# Patient Record
Sex: Female | Born: 1975 | Race: Black or African American | Hispanic: No | Marital: Single | Smoking: Never smoker
Health system: Southern US, Community
[De-identification: ages and names within clinical notes are randomized; demographics above are authoritative.]

## PROBLEM LIST (undated history)

## (undated) DIAGNOSIS — M169 Osteoarthritis of hip, unspecified: Secondary | ICD-10-CM

## (undated) DIAGNOSIS — R7303 Prediabetes: Secondary | ICD-10-CM

## (undated) DIAGNOSIS — M51369 Other intervertebral disc degeneration, lumbar region without mention of lumbar back pain or lower extremity pain: Secondary | ICD-10-CM

## (undated) DIAGNOSIS — M5136 Other intervertebral disc degeneration, lumbar region: Secondary | ICD-10-CM

## (undated) DIAGNOSIS — M797 Fibromyalgia: Secondary | ICD-10-CM

## (undated) DIAGNOSIS — I10 Essential (primary) hypertension: Secondary | ICD-10-CM

## (undated) HISTORY — PX: TONSILLECTOMY: SUR1361

## (undated) HISTORY — PX: TUBAL LIGATION: SHX77

## (undated) HISTORY — PX: ABDOMINAL HYSTERECTOMY: SHX81

---

## 2009-09-30 ENCOUNTER — Emergency Department (HOSPITAL_BASED_OUTPATIENT_CLINIC_OR_DEPARTMENT_OTHER): Admission: EM | Admit: 2009-09-30 | Discharge: 2009-10-01 | Payer: Self-pay | Admitting: Emergency Medicine

## 2009-10-01 ENCOUNTER — Ambulatory Visit: Payer: Self-pay | Admitting: Interventional Radiology

## 2010-09-28 ENCOUNTER — Emergency Department (HOSPITAL_BASED_OUTPATIENT_CLINIC_OR_DEPARTMENT_OTHER)
Admission: EM | Admit: 2010-09-28 | Discharge: 2010-09-28 | Payer: Self-pay | Source: Home / Self Care | Admitting: Emergency Medicine

## 2010-09-29 LAB — URINALYSIS, ROUTINE W REFLEX MICROSCOPIC
Bilirubin Urine: NEGATIVE
Hgb urine dipstick: NEGATIVE
Ketones, ur: NEGATIVE mg/dL
Nitrite: NEGATIVE
Protein, ur: NEGATIVE mg/dL
Specific Gravity, Urine: 1.027 (ref 1.005–1.030)
Urine Glucose, Fasting: NEGATIVE mg/dL
Urobilinogen, UA: 0.2 mg/dL (ref 0.0–1.0)
pH: 6 (ref 5.0–8.0)

## 2010-09-29 LAB — PREGNANCY, URINE: Preg Test, Ur: NEGATIVE

## 2010-09-29 LAB — WET PREP, GENITAL
Trich, Wet Prep: NONE SEEN
Yeast Wet Prep HPF POC: NONE SEEN

## 2010-10-04 LAB — GC/CHLAMYDIA PROBE AMP, GENITAL
Chlamydia, DNA Probe: NEGATIVE
GC Probe Amp, Genital: NEGATIVE

## 2010-11-29 LAB — DIFFERENTIAL
Basophils Absolute: 0.1 10*3/uL (ref 0.0–0.1)
Basophils Relative: 2 % — ABNORMAL HIGH (ref 0–1)
Eosinophils Absolute: 0 10*3/uL (ref 0.0–0.7)
Eosinophils Relative: 1 % (ref 0–5)
Lymphocytes Relative: 40 % (ref 12–46)
Lymphs Abs: 2.1 10*3/uL (ref 0.7–4.0)
Monocytes Absolute: 0.4 10*3/uL (ref 0.1–1.0)
Monocytes Relative: 7 % (ref 3–12)
Neutro Abs: 2.6 10*3/uL (ref 1.7–7.7)
Neutrophils Relative %: 50 % (ref 43–77)

## 2010-11-29 LAB — COMPREHENSIVE METABOLIC PANEL
ALT: 19 U/L (ref 0–35)
AST: 26 U/L (ref 0–37)
Albumin: 5.3 g/dL — ABNORMAL HIGH (ref 3.5–5.2)
Alkaline Phosphatase: 58 U/L (ref 39–117)
BUN: 16 mg/dL (ref 6–23)
CO2: 27 mEq/L (ref 19–32)
Calcium: 10 mg/dL (ref 8.4–10.5)
Chloride: 103 mEq/L (ref 96–112)
Creatinine, Ser: 0.9 mg/dL (ref 0.4–1.2)
GFR calc Af Amer: >60 mL/min (ref >60)
GFR calc non Af Amer: >60 mL/min (ref >60)
Glucose, Bld: 71 mg/dL (ref 70–99)
Potassium: 4.3 mEq/L (ref 3.5–5.1)
Sodium: 144 mEq/L (ref 135–145)
Total Bilirubin: 0.9 mg/dL (ref 0.3–1.2)
Total Protein: 9.2 g/dL — ABNORMAL HIGH (ref 6.0–8.3)

## 2010-11-29 LAB — URINALYSIS, ROUTINE W REFLEX MICROSCOPIC
Glucose, UA: NEGATIVE mg/dL
Hgb urine dipstick: NEGATIVE
Ketones, ur: 40 mg/dL — AB
Nitrite: NEGATIVE
Protein, ur: NEGATIVE mg/dL
Specific Gravity, Urine: 1.03 (ref 1.005–1.030)
Urobilinogen, UA: 0.2 mg/dL (ref 0.0–1.0)
pH: 6 (ref 5.0–8.0)

## 2010-11-29 LAB — CBC
HCT: 43.5 % (ref 36.0–46.0)
Hemoglobin: 14.4 g/dL (ref 12.0–15.0)
MCHC: 33.1 g/dL (ref 30.0–36.0)
MCV: 83.5 fL (ref 78.0–100.0)
Platelets: 268 10*3/uL (ref 150–400)
RBC: 5.2 MIL/uL — ABNORMAL HIGH (ref 3.87–5.11)
RDW: 12.3 % (ref 11.5–15.5)
WBC: 5.2 10*3/uL (ref 4.0–10.5)

## 2010-11-29 LAB — PREGNANCY, URINE: Preg Test, Ur: NEGATIVE

## 2010-11-29 LAB — LIPASE, BLOOD: Lipase: 202 U/L (ref 23–300)

## 2010-12-14 ENCOUNTER — Emergency Department (INDEPENDENT_AMBULATORY_CARE_PROVIDER_SITE_OTHER): Payer: Self-pay

## 2010-12-14 ENCOUNTER — Emergency Department (HOSPITAL_BASED_OUTPATIENT_CLINIC_OR_DEPARTMENT_OTHER)
Admission: EM | Admit: 2010-12-14 | Discharge: 2010-12-14 | Disposition: A | Discharge status: Home / Self Care | Payer: Self-pay | Attending: Emergency Medicine | Admitting: Emergency Medicine

## 2010-12-14 DIAGNOSIS — N898 Other specified noninflammatory disorders of vagina: Secondary | ICD-10-CM | POA: Insufficient documentation

## 2010-12-14 DIAGNOSIS — R1031 Right lower quadrant pain: Secondary | ICD-10-CM

## 2010-12-14 DIAGNOSIS — N8000 Endometriosis of the uterus, unspecified: Secondary | ICD-10-CM | POA: Insufficient documentation

## 2010-12-14 DIAGNOSIS — I10 Essential (primary) hypertension: Secondary | ICD-10-CM | POA: Insufficient documentation

## 2010-12-14 DIAGNOSIS — R109 Unspecified abdominal pain: Secondary | ICD-10-CM | POA: Insufficient documentation

## 2010-12-14 LAB — WET PREP, GENITAL
Clue Cells Wet Prep HPF POC: NONE SEEN
Trich, Wet Prep: NONE SEEN
Yeast Wet Prep HPF POC: NONE SEEN

## 2010-12-14 LAB — URINALYSIS, ROUTINE W REFLEX MICROSCOPIC
Bilirubin Urine: NEGATIVE
Glucose, UA: NEGATIVE mg/dL
Hgb urine dipstick: NEGATIVE
Ketones, ur: NEGATIVE mg/dL
Nitrite: NEGATIVE
Protein, ur: NEGATIVE mg/dL
Specific Gravity, Urine: 1.023 (ref 1.005–1.030)
Urobilinogen, UA: 0.2 mg/dL (ref 0.0–1.0)
pH: 6 (ref 5.0–8.0)

## 2010-12-14 LAB — PREGNANCY, URINE: Preg Test, Ur: NEGATIVE

## 2010-12-15 LAB — GC/CHLAMYDIA PROBE AMP, GENITAL
Chlamydia, DNA Probe: NEGATIVE
GC Probe Amp, Genital: NEGATIVE

## 2011-08-22 ENCOUNTER — Emergency Department (HOSPITAL_BASED_OUTPATIENT_CLINIC_OR_DEPARTMENT_OTHER)
Admission: EM | Admit: 2011-08-22 | Discharge: 2011-08-22 | Disposition: A | Discharge status: Home / Self Care | Payer: Self-pay | Attending: Emergency Medicine | Admitting: Emergency Medicine

## 2011-08-22 ENCOUNTER — Encounter: Payer: Self-pay | Admitting: *Deleted

## 2011-08-22 DIAGNOSIS — J069 Acute upper respiratory infection, unspecified: Secondary | ICD-10-CM | POA: Insufficient documentation

## 2011-08-22 DIAGNOSIS — I10 Essential (primary) hypertension: Secondary | ICD-10-CM | POA: Insufficient documentation

## 2011-08-22 DIAGNOSIS — J111 Influenza due to unidentified influenza virus with other respiratory manifestations: Secondary | ICD-10-CM

## 2011-08-22 DIAGNOSIS — R059 Cough, unspecified: Secondary | ICD-10-CM | POA: Insufficient documentation

## 2011-08-22 DIAGNOSIS — R05 Cough: Secondary | ICD-10-CM | POA: Insufficient documentation

## 2011-08-22 HISTORY — DX: Essential (primary) hypertension: I10

## 2011-08-22 NOTE — ED Provider Notes (Signed)
History     CSN: [id number] Arrival date & time: 08/22/2011  3:56 PM   First MD Initiated Contact with Patient 08/22/11 1539      Chief Complaint  Patient presents with  . URI    (Consider location/radiation/quality/duration/timing/severity/associated sxs/prior treatment) HPI Patient with uri symptoms, sore throat, fever, cough, rhinorrhea, muscle aches, nausea, and vomiting beginning on Saturday.  Patient without fever now for 36 hours.  No flu shot.  Last vomited this a.m.  Taking po with nausea but no vomiting now.  No diarrhea.   Exposure in home to children with similar symptoms.  Patient taking otc meds.   Past Medical History  Diagnosis Date  . Hypertension     Past Surgical History  Procedure Date  . Tubal ligation   . Tonsillectomy     No family history on file.  History  Substance Use Topics  . Smoking status: Never Smoker   . Smokeless tobacco: Not on file  . Alcohol Use: No    OB History    Grav Para Term Preterm Abortions TAB SAB Ect Mult Living                  Review of Systems  All other systems reviewed and are negative.    Allergies  Review of patient's allergies indicates no known allergies.  Home Medications  No current outpatient prescriptions on file.  BP 150/90  Pulse 91  Temp(Src) 99.4 F (37.4 C) (Oral)  Resp 22  SpO2 100%  Physical Exam  Constitutional: She is oriented to person, place, and time. She appears well-developed and well-nourished.  HENT:  Head: Normocephalic and atraumatic.  Right Ear: External ear normal.  Left Ear: External ear normal.  Nose: Nose normal.  Mouth/Throat: Oropharynx is clear and moist.  Eyes: Conjunctivae and EOM are normal. Pupils are equal, round, and reactive to light.  Neck: Normal range of motion. Neck supple.  Cardiovascular: Normal rate and regular rhythm.   Pulmonary/Chest: Effort normal and breath sounds normal.  Abdominal: Bowel sounds are normal.  Musculoskeletal: Normal range  of motion.  Neurological: She is alert and oriented to person, place, and time. She has normal reflexes.  Skin: Skin is warm and dry.  Psychiatric: She has a normal mood and affect. Her behavior is normal. Judgment and thought content normal.    ED Course  Procedures (including critical care time)  Labs Reviewed - No data to display No results found.   No diagnosis found.    MDM          Hilario Quarry, MD 08/22/11 [phone]

## 2011-08-22 NOTE — ED Notes (Signed)
Headache sore throat ear pain aching all over and fever x 2 days.

## 2011-08-22 NOTE — ED Notes (Signed)
No answer in waiting room 

## 2014-12-18 DIAGNOSIS — G43009 Migraine without aura, not intractable, without status migrainosus: Secondary | ICD-10-CM | POA: Insufficient documentation

## 2014-12-18 DIAGNOSIS — I10 Essential (primary) hypertension: Secondary | ICD-10-CM | POA: Insufficient documentation

## 2015-05-29 DIAGNOSIS — K219 Gastro-esophageal reflux disease without esophagitis: Secondary | ICD-10-CM | POA: Insufficient documentation

## 2015-08-21 DIAGNOSIS — G43009 Migraine without aura, not intractable, without status migrainosus: Secondary | ICD-10-CM | POA: Insufficient documentation

## 2016-02-22 DIAGNOSIS — M7918 Myalgia, other site: Secondary | ICD-10-CM | POA: Insufficient documentation

## 2016-02-22 DIAGNOSIS — M47816 Spondylosis without myelopathy or radiculopathy, lumbar region: Secondary | ICD-10-CM | POA: Insufficient documentation

## 2016-03-10 ENCOUNTER — Encounter (HOSPITAL_BASED_OUTPATIENT_CLINIC_OR_DEPARTMENT_OTHER): Payer: Self-pay | Admitting: *Deleted

## 2016-03-10 ENCOUNTER — Emergency Department (HOSPITAL_BASED_OUTPATIENT_CLINIC_OR_DEPARTMENT_OTHER)
Admission: EM | Admit: 2016-03-10 | Discharge: 2016-03-10 | Disposition: A | Discharge status: Home / Self Care | Payer: PRIVATE HEALTH INSURANCE | Attending: Emergency Medicine | Admitting: Emergency Medicine

## 2016-03-10 DIAGNOSIS — M545 Low back pain: Secondary | ICD-10-CM

## 2016-03-10 DIAGNOSIS — I10 Essential (primary) hypertension: Secondary | ICD-10-CM | POA: Insufficient documentation

## 2016-03-10 LAB — PREGNANCY, URINE: Preg Test, Ur: NEGATIVE

## 2016-03-10 MED ORDER — HYDROCODONE-ACETAMINOPHEN 5-325 MG PO TABS: 2.0000 | ORAL_TABLET | ORAL | Status: DC | PRN

## 2016-03-10 MED ORDER — DIAZEPAM 5 MG/ML IJ SOLN
5.0000 mg | Freq: Once | INTRAMUSCULAR | Status: AC
  Administered 2016-03-10: 5 mg via INTRAVENOUS
  Filled 2016-03-10: qty 2

## 2016-03-10 MED ORDER — CYCLOBENZAPRINE HCL 10 MG PO TABS: 10.0000 mg | ORAL_TABLET | Freq: Two times a day (BID) | ORAL | Status: DC | PRN

## 2016-03-10 MED ORDER — NAPROXEN 500 MG PO TABS: 500.0000 mg | ORAL_TABLET | Freq: Two times a day (BID) | ORAL | Status: DC

## 2016-03-10 MED ORDER — METHYLPREDNISOLONE 4 MG PO TBPK: ORAL_TABLET | ORAL | Status: DC

## 2016-03-10 MED ORDER — MORPHINE SULFATE (PF) 4 MG/ML IV SOLN
4.0000 mg | Freq: Once | INTRAVENOUS | Status: AC
  Administered 2016-03-10: 4 mg via INTRAVENOUS
  Filled 2016-03-10: qty 1

## 2016-03-10 MED ORDER — ONDANSETRON HCL 4 MG/2ML IJ SOLN
4.0000 mg | Freq: Once | INTRAMUSCULAR | Status: AC
  Administered 2016-03-10: 4 mg via INTRAVENOUS
  Filled 2016-03-10: qty 2

## 2016-03-10 MED ORDER — KETOROLAC TROMETHAMINE 30 MG/ML IJ SOLN
30.0000 mg | Freq: Once | INTRAMUSCULAR | Status: AC
  Administered 2016-03-10: 30 mg via INTRAVENOUS
  Filled 2016-03-10: qty 1

## 2016-03-10 MED ORDER — DEXAMETHASONE SODIUM PHOSPHATE 10 MG/ML IJ SOLN
10.0000 mg | Freq: Once | INTRAMUSCULAR | Status: AC
  Administered 2016-03-10: 10 mg via INTRAVENOUS
  Filled 2016-03-10: qty 1

## 2016-03-10 NOTE — ED Notes (Signed)
Lower back pain x 2 months. States she was going to a chiropractor that was not helping her pain.

## 2016-03-10 NOTE — ED Notes (Signed)
Pt verbalizes understanding of d/c instructions and denies any further needs at this time. 

## 2016-03-10 NOTE — Discharge Instructions (Signed)
I have scheduled an outpatient MRI for further evaluation of your back pain.  Follow up with Dr. Pearletha ForgeHudnall in 1 week.  Take the Medrol dosepak, Naprosyn, Flexeril, and Norco as needed for pain control.  Back Pain, Adult Back pain is very common in adults.The cause of back pain is rarely dangerous and the pain often gets better over time.The cause of your back pain may not be known. Some common causes of back pain include:  Strain of the muscles or ligaments supporting the spine.  Wear and tear (degeneration) of the spinal disks.  Arthritis.  Direct injury to the back. For many people, back pain may return. Since back pain is rarely dangerous, most people can learn to manage this condition on their own. HOME CARE INSTRUCTIONS Watch your back pain for any changes. The following actions may help to lessen any discomfort you are feeling:  Remain active. It is stressful on your back to sit or stand in one place for long periods of time. Do not sit, drive, or stand in one place for more than 30 minutes at a time. Take short walks on even surfaces as soon as you are able.Try to increase the length of time you walk each day.  Exercise regularly as directed by your health care provider. Exercise helps your back heal faster. It also helps avoid future injury by keeping your muscles strong and flexible.  Do not stay in bed.Resting more than 1-2 days can delay your recovery.  Pay attention to your body when you bend and lift. The most comfortable positions are those that put less stress on your recovering back. Always use proper lifting techniques, including:  Bending your knees.  Keeping the load close to your body.  Avoiding twisting.  Find a comfortable position to sleep. Use a firm mattress and lie on your side with your knees slightly bent. If you lie on your back, put a pillow under your knees.  Avoid feeling anxious or stressed.Stress increases muscle tension and can worsen back  pain.It is important to recognize when you are anxious or stressed and learn ways to manage it, such as with exercise.  Take medicines only as directed by your health care provider. Over-the-counter medicines to reduce pain and inflammation are often the most helpful.Your health care provider may prescribe muscle relaxant drugs.These medicines help dull your pain so you can more quickly return to your normal activities and healthy exercise.  Apply ice to the injured area:  Put ice in a plastic bag.  Place a towel between your skin and the bag.  Leave the ice on for 20 minutes, 2-3 times a day for the first 2-3 days. After that, ice and heat may be alternated to reduce pain and spasms.  Maintain a healthy weight. Excess weight puts extra stress on your back and makes it difficult to maintain good posture. SEEK MEDICAL CARE IF:  You have pain that is not relieved with rest or medicine.  You have increasing pain going down into the legs or buttocks.  You have pain that does not improve in one week.  You have night pain.  You lose weight.  You have a fever or chills. SEEK IMMEDIATE MEDICAL CARE IF:   You develop new bowel or bladder control problems.  You have unusual weakness or numbness in your arms or legs.  You develop nausea or vomiting.  You develop abdominal pain.  You feel faint.   This information is not intended to replace advice given  to you by your health care provider. Make sure you discuss any questions you have with your health care provider.   Document Released: 08/29/2005 Document Revised: 09/19/2014 Document Reviewed: 12/31/2013 Elsevier Interactive Patient Education [employer].

## 2016-03-10 NOTE — ED Provider Notes (Signed)
CSN: [id number]     Arrival date & time 03/10/16  1720 History   First MD Initiated Contact with Patient 03/10/16 1729     Chief Complaint  Patient presents with  . Back Pain     (Consider location/radiation/quality/duration/timing/severity/associated sxs/prior Treatment) Patient is a 40 y.o. female presenting with back pain. The history is provided by the patient.  Back Pain Location:  Lumbar spine Quality:  Stabbing and shooting Radiates to:  L posterior upper leg and R posterior upper leg Pain severity:  Moderate Pain is:  Same all the time Onset quality:  Gradual Duration:  2 months Timing:  Constant Progression:  Unchanged Relieved by:  NSAIDs and narcotics Worsened by:  Standing Ineffective treatments:  None tried Associated symptoms: leg pain   Associated symptoms: no abdominal pain, no bladder incontinence, no bowel incontinence, no dysuria, no fever, no numbness, no paresthesias, no pelvic pain, no perianal numbness, no tingling, no weakness and no weight loss   Risk factors: not obese, not pregnant, no recent surgery and no steroid use    Patient presents for evaluation of constant, sharp, stabbing low back pain that radiates to bilateral thighs. No injury or trauma. She has tried hydrocodone, mobilc and going today chiropractor without relief. She has an appointment scheduled with a spine surgeon in August. No fever, chills, abdominal pain, urinary symptoms, pelvic pain, saddle anesthesia, bowel or bladder incontinence.  No IVDU.  She states she has had xrays done which were normal.   Past Medical History  Diagnosis Date  . Hypertension    Past Surgical History  Procedure Laterality Date  . Tubal ligation    . Tonsillectomy     No family history on file. Social History  Substance Use Topics  . Smoking status: Never Smoker   . Smokeless tobacco: None  . Alcohol Use: No   OB History    No data available     Review of Systems  Constitutional: Negative for  fever and weight loss.  Gastrointestinal: Negative for abdominal pain and bowel incontinence.  Genitourinary: Negative for bladder incontinence, dysuria and pelvic pain.  Musculoskeletal: Positive for back pain.  Neurological: Negative for tingling, weakness, numbness and paresthesias.  All other systems reviewed and are negative.     Allergies  Review of patient's allergies indicates no known allergies.  Home Medications   Prior to Admission medications   Not on File   BP 158/104 mmHg  Pulse 72  Temp(Src) 98.2 F (36.8 C) (Oral)  Resp 20  Ht 5\' 2"  (1.575 m)  Wt 72.576 kg  BMI 29.26 kg/m2  SpO2 100%  LMP 02/08/2016 Physical Exam  Constitutional: She is oriented to person, place, and time. She appears well-developed and well-nourished.  HENT:  Head: Atraumatic.  Eyes: Conjunctivae are normal.  Cardiovascular: Normal rate, regular rhythm, normal heart sounds and intact distal pulses.   Pulses:      Dorsalis pedis pulses are 2+ on the right side, and 2+ on the left side.  Pulmonary/Chest: Effort normal and breath sounds normal.  Abdominal: Soft. Bowel sounds are normal. She exhibits no distension. There is no tenderness.  Musculoskeletal: She exhibits tenderness.  No spinous process tenderness.  No step offs. No crepitus.  Diffuse lumbar and sciatic notch tenderness.   Neurological: She is alert and oriented to person, place, and time.  No saddle anesthesia. Strength and sensation intact bilaterally throughout lower extremities.  Normal gait.   Skin: Skin is warm and dry.  Psychiatric: She has  a normal mood and affect. Her behavior is normal.    ED Course  Procedures (including critical care time) Labs Review Labs Reviewed  PREGNANCY, URINE    Imaging Review No results found. I have personally reviewed and evaluated these images and lab results as part of my medical decision-making.   EKG Interpretation None      MDM   Final diagnoses:  Bilateral low back  pain, with sciatica presence unspecified   Patient presents with back pain.  VSS, NAD.  No injury/trauma.  Normal neurological exam.  Doubt epidural abscess, cauda equina, AAA.  She is not pregnant. Patient received Toradal, Decadron, Valium, and Morphine in ED with improvement.  [facility]  Plan to discharge home with Methylprednisolone, Naproxen, and Norco. Outpatient MRI scheduled.  Follow up PCP.  Discussed return precautions.  Patient agrees and acknowledges the above plan for discharge.      Cheri FowlerKayla Celise Bazar, PA-C 03/10/16 1952  Jacalyn LefevreJulie Haviland, MD 03/10/16 2238

## 2016-03-11 ENCOUNTER — Other Ambulatory Visit (HOSPITAL_BASED_OUTPATIENT_CLINIC_OR_DEPARTMENT_OTHER): Payer: Self-pay | Admitting: *Deleted

## 2016-03-11 DIAGNOSIS — Z0189 Encounter for other specified special examinations: Secondary | ICD-10-CM

## 2016-03-11 DIAGNOSIS — Z139 Encounter for screening, unspecified: Secondary | ICD-10-CM

## 2016-03-11 DIAGNOSIS — Z1389 Encounter for screening for other disorder: Secondary | ICD-10-CM

## 2016-03-12 ENCOUNTER — Ambulatory Visit (HOSPITAL_BASED_OUTPATIENT_CLINIC_OR_DEPARTMENT_OTHER)
Admission: RE | Admit: 2016-03-12 | Discharge: 2016-03-12 | Disposition: A | Discharge status: Home / Self Care | Payer: PRIVATE HEALTH INSURANCE | Source: Ambulatory Visit | Attending: *Deleted | Admitting: *Deleted

## 2016-03-12 ENCOUNTER — Ambulatory Visit (HOSPITAL_BASED_OUTPATIENT_CLINIC_OR_DEPARTMENT_OTHER)
Admission: RE | Admit: 2016-03-12 | Discharge: 2016-03-12 | Disposition: A | Discharge status: Home / Self Care | Payer: PRIVATE HEALTH INSURANCE | Source: Ambulatory Visit | Attending: Emergency Medicine | Admitting: Emergency Medicine

## 2016-03-12 DIAGNOSIS — Z1389 Encounter for screening for other disorder: Secondary | ICD-10-CM | POA: Insufficient documentation

## 2016-03-12 DIAGNOSIS — N949 Unspecified condition associated with female genital organs and menstrual cycle: Secondary | ICD-10-CM | POA: Insufficient documentation

## 2016-03-12 DIAGNOSIS — M545 Low back pain: Secondary | ICD-10-CM | POA: Diagnosis not present

## 2016-03-21 ENCOUNTER — Ambulatory Visit (INDEPENDENT_AMBULATORY_CARE_PROVIDER_SITE_OTHER): Payer: PRIVATE HEALTH INSURANCE | Admitting: Family Medicine

## 2016-03-21 VITALS — BP 141/100 | HR 80 | Ht 62.0 in | Wt 160.0 lb

## 2016-03-21 DIAGNOSIS — M549 Dorsalgia, unspecified: Secondary | ICD-10-CM

## 2016-03-21 NOTE — Patient Instructions (Signed)
Your exam is reassuring. You have already tried steroids, anti-inflammatories, muscle relaxants, and pain medication, chiropractic care. Start physical therapy and do home exercises/stretches on days you don't go to therapy. Follow up with me 4-6 weeks after starting this. As mentioned follow up with your doctor about the 7cm cyst though this is unrelated to your back pain. If not improving I would recommend going ahead with imaging of your cervical spine.

## 2016-03-22 ENCOUNTER — Encounter: Payer: Self-pay | Admitting: Family Medicine

## 2016-03-23 DIAGNOSIS — M549 Dorsalgia, unspecified: Secondary | ICD-10-CM | POA: Insufficient documentation

## 2016-03-23 NOTE — Progress Notes (Signed)
PCP: No PCP Per Patient  Subjective:   HPI: Patient is a 40 y.o. female here for neck, back pain.  Patient reports for at least a couple months she has had pain on left side of body. Was afraid she was having a stroke initially. Pain currently gets posterior left neck, upper back, low back. Improved with rest, worse with motion. Pain up to 10/10 and sharp. Now taking naproxen, flexeril with norco as needed. Not much improvement with prednisone dose pack also. Tried chiropractic care. MRI of lumbar spine normal except for a 7cm cyst - recommended f/u with PCP. No bowel/bladder dysfunction. Can radiates down legs and arms at times. No skin changes, current numbness.  Past Medical History  Diagnosis Date  . Hypertension     Current Outpatient Prescriptions on File Prior to Visit  Medication Sig Dispense Refill  . cyclobenzaprine (FLEXERIL) 10 MG tablet Take 1 tablet (10 mg total) by mouth 2 (two) times daily as needed for muscle spasms. 10 tablet 0  . HYDROcodone-acetaminophen (NORCO/VICODIN) 5-325 MG tablet Take 2 tablets by mouth every 4 (four) hours as needed. 12 tablet 0  . methylPREDNISolone (MEDROL DOSEPAK) 4 MG TBPK tablet Follow instructions on package. 21 tablet 0  . naproxen (NAPROSYN) 500 MG tablet Take 1 tablet (500 mg total) by mouth 2 (two) times daily. 30 tablet 0   No current facility-administered medications on file prior to visit.    Past Surgical History  Procedure Laterality Date  . Tubal ligation    . Tonsillectomy      Allergies  Allergen Reactions  . Lisinopril Shortness Of Breath    Social History   Social History  . Marital Status: Single    Spouse Name: N/A  . Number of Children: N/A  . Years of Education: N/A   Occupational History  . Not on file.   Social History Main Topics  . Smoking status: Never Smoker   . Smokeless tobacco: Not on file  . Alcohol Use: No  . Drug Use: No  . Sexual Activity: Not on file   Other Topics Concern   . Not on file   Social History Narrative    No family history on file.  BP 141/100 mmHg  Pulse 80  Ht 5\' 2"  (1.575 m)  Wt 160 lb (72.576 kg)  BMI 29.26 kg/m2  LMP 02/08/2016  Review of Systems: See HPI above.    Objective:  Physical Exam:  Gen: NAD, comfortable in exam room  Neck: No gross deformity, swelling, bruising. TTP left > right paraspinal cervical regions.  No midline/bony TTP. FROM neck - pain on flexion, extension. BUE strength 5/5.   Sensation intact to light touch.   2+ equal reflexes in triceps, biceps, brachioradialis tendons. Negative spurlings. NV intact distal BUEs.  Back: No gross deformity, scoliosis. TTP left > right paraspinal lumbar regions.  No midline or bony TTP. FROM. Strength LEs 5/5 all muscle groups.   2+ MSRs in patellar and achilles tendons, equal bilaterally. Negative SLRs. Sensation intact to light touch bilaterally. Negative logroll bilateral hips Negative fabers and piriformis stretches.    Assessment & Plan:  1. Neck, back pain - exam is reassuring.  Consistent with muscle strain.  Start physical therapy, home exercise program.  Has already tried steroid dose pack, nsaids, muscle relaxants, pain medication, chiropractic care.  Had lumbar spine MRI which was reassuring also.  F/u in 4-6 weeks.  Consider imaging of cervical spine if not improving.

## 2016-03-23 NOTE — Assessment & Plan Note (Signed)
exam is reassuring.  Consistent with muscle strain.  Start physical therapy, home exercise program.  Has already tried steroid dose pack, nsaids, muscle relaxants, pain medication, chiropractic care.  Had lumbar spine MRI which was reassuring also.  F/u in 4-6 weeks.  Consider imaging of cervical spine if not improving.

## 2016-04-18 ENCOUNTER — Encounter: Payer: Self-pay | Admitting: Family Medicine

## 2016-04-18 ENCOUNTER — Ambulatory Visit (INDEPENDENT_AMBULATORY_CARE_PROVIDER_SITE_OTHER): Payer: PRIVATE HEALTH INSURANCE | Admitting: Family Medicine

## 2016-04-18 VITALS — BP 170/120 | HR 81 | Ht 62.0 in | Wt 152.0 lb

## 2016-04-18 DIAGNOSIS — M542 Cervicalgia: Secondary | ICD-10-CM | POA: Diagnosis not present

## 2016-04-18 DIAGNOSIS — M549 Dorsalgia, unspecified: Secondary | ICD-10-CM

## 2016-04-18 MED ORDER — NORTRIPTYLINE HCL 25 MG PO CAPS: 25.0000 mg | ORAL_CAPSULE | Freq: Every day | ORAL | 2 refills | Status: AC

## 2016-04-18 NOTE — Patient Instructions (Signed)
Next step is to go ahead with a cervical spine MRI. If this is normal I think you should see a rheumatologist for evaluation, rule out autoimmune causes of pain, weakness. I would encourage you to be as active as possible, stretching, light cardio (cycling, walking, anything in the water), very light weightlifting. Your pain generally is less the stronger these muscles are (less likely to spasm).

## 2016-04-20 ENCOUNTER — Encounter (HOSPITAL_BASED_OUTPATIENT_CLINIC_OR_DEPARTMENT_OTHER): Payer: Self-pay | Admitting: Emergency Medicine

## 2016-04-20 ENCOUNTER — Emergency Department (HOSPITAL_BASED_OUTPATIENT_CLINIC_OR_DEPARTMENT_OTHER): Payer: PRIVATE HEALTH INSURANCE

## 2016-04-20 ENCOUNTER — Emergency Department (HOSPITAL_BASED_OUTPATIENT_CLINIC_OR_DEPARTMENT_OTHER)
Admission: EM | Admit: 2016-04-20 | Discharge: 2016-04-20 | Disposition: A | Discharge status: Home / Self Care | Payer: PRIVATE HEALTH INSURANCE | Attending: Emergency Medicine | Admitting: Emergency Medicine

## 2016-04-20 ENCOUNTER — Ambulatory Visit: Payer: Medicaid Other | Admitting: Family Medicine

## 2016-04-20 DIAGNOSIS — R197 Diarrhea, unspecified: Secondary | ICD-10-CM | POA: Insufficient documentation

## 2016-04-20 DIAGNOSIS — Z79899 Other long term (current) drug therapy: Secondary | ICD-10-CM | POA: Insufficient documentation

## 2016-04-20 DIAGNOSIS — R072 Precordial pain: Secondary | ICD-10-CM | POA: Diagnosis present

## 2016-04-20 DIAGNOSIS — I10 Essential (primary) hypertension: Secondary | ICD-10-CM | POA: Diagnosis not present

## 2016-04-20 DIAGNOSIS — R1013 Epigastric pain: Secondary | ICD-10-CM | POA: Insufficient documentation

## 2016-04-20 LAB — CBC WITH DIFFERENTIAL/PLATELET
Band Neutrophils: 3 %
Basophils Absolute: 0 K/uL (ref 0.0–0.1)
Basophils Relative: 0 %
Eosinophils Absolute: 0.3 K/uL (ref 0.0–0.7)
Eosinophils Relative: 5 %
HCT: 41.3 % (ref 36.0–46.0)
Hemoglobin: 13.6 g/dL (ref 12.0–15.0)
Lymphocytes Relative: 42 %
Lymphs Abs: 2.4 K/uL (ref 0.7–4.0)
MCH: 27.5 pg (ref 26.0–34.0)
MCHC: 32.9 g/dL (ref 30.0–36.0)
MCV: 83.6 fL (ref 78.0–100.0)
Monocytes Absolute: 0.3 K/uL (ref 0.1–1.0)
Monocytes Relative: 6 %
Neutro Abs: 2.7 K/uL (ref 1.7–7.7)
Neutrophils Relative %: 44 %
Platelets: 234 K/uL (ref 150–400)
RBC: 4.94 MIL/uL (ref 3.87–5.11)
RDW: 13.3 % (ref 11.5–15.5)
WBC: 5.7 K/uL (ref 4.0–10.5)

## 2016-04-20 LAB — URINALYSIS, ROUTINE W REFLEX MICROSCOPIC
Bilirubin Urine: NEGATIVE
Glucose, UA: NEGATIVE mg/dL
Hgb urine dipstick: NEGATIVE
Ketones, ur: NEGATIVE mg/dL
Nitrite: NEGATIVE
Protein, ur: NEGATIVE mg/dL
Specific Gravity, Urine: 1.014 (ref 1.005–1.030)
pH: 6.5 (ref 5.0–8.0)

## 2016-04-20 LAB — COMPREHENSIVE METABOLIC PANEL WITH GFR
ALT: 15 U/L (ref 14–54)
AST: 24 U/L (ref 15–41)
Albumin: 4.5 g/dL (ref 3.5–5.0)
Alkaline Phosphatase: 38 U/L (ref 38–126)
Anion gap: 8 (ref 5–15)
BUN: 16 mg/dL (ref 6–20)
CO2: 29 mmol/L (ref 22–32)
Calcium: 9.7 mg/dL (ref 8.9–10.3)
Chloride: 102 mmol/L (ref 101–111)
Creatinine, Ser: 1.08 mg/dL — ABNORMAL HIGH (ref 0.44–1.00)
GFR calc Af Amer: >60 mL/min
GFR calc non Af Amer: >60 mL/min
Glucose, Bld: 86 mg/dL (ref 65–99)
Potassium: 4.2 mmol/L (ref 3.5–5.1)
Sodium: 139 mmol/L (ref 135–145)
Total Bilirubin: 0.8 mg/dL (ref 0.3–1.2)
Total Protein: 7.7 g/dL (ref 6.5–8.1)

## 2016-04-20 LAB — URINE MICROSCOPIC-ADD ON: RBC / HPF: NONE SEEN RBC/hpf (ref 0–5)

## 2016-04-20 LAB — PREGNANCY, URINE: Preg Test, Ur: NEGATIVE

## 2016-04-20 LAB — LIPASE, BLOOD: Lipase: 40 U/L (ref 11–51)

## 2016-04-20 MED ORDER — ONDANSETRON HCL 4 MG/2ML IJ SOLN
4.0000 mg | Freq: Once | INTRAMUSCULAR | Status: DC
  Filled 2016-04-20: qty 2

## 2016-04-20 MED ORDER — ONDANSETRON 8 MG PO TBDP
8.0000 mg | ORAL_TABLET | Freq: Once | ORAL | Status: AC
  Administered 2016-04-20: 8 mg via ORAL
  Filled 2016-04-20: qty 1

## 2016-04-20 MED ORDER — GI COCKTAIL ~~LOC~~
30.0000 mL | Freq: Once | ORAL | Status: AC
  Administered 2016-04-20: 30 mL via ORAL
  Filled 2016-04-20: qty 30

## 2016-04-20 MED ORDER — MORPHINE SULFATE (PF) 4 MG/ML IV SOLN
4.0000 mg | Freq: Once | INTRAVENOUS | Status: AC
  Administered 2016-04-20: 4 mg via INTRAMUSCULAR

## 2016-04-20 MED ORDER — MORPHINE SULFATE (PF) 4 MG/ML IV SOLN
4.0000 mg | Freq: Once | INTRAVENOUS | Status: DC
  Filled 2016-04-20: qty 1

## 2016-04-20 MED ORDER — ACETAMINOPHEN 500 MG PO TABS
1000.0000 mg | ORAL_TABLET | Freq: Once | ORAL | Status: AC
  Administered 2016-04-20: 1000 mg via ORAL
  Filled 2016-04-20: qty 2

## 2016-04-20 NOTE — ED Provider Notes (Signed)
[facility] Provider Note   CSN: [id number] Arrival date & time: 04/20/16  1643  First Provider Contact:   First MD Initiated Contact with Patient 04/20/16 1653     By signing my name below, I, Emmanuella Mensah, attest that this documentation has been prepared under the direction and in the presence of Roxy Horseman, PA-C. Electronically Signed: Angelene Giovanni, ED Scribe. 04/20/16. 5:02 PM.    History   Chief Complaint Chief Complaint  Patient presents with  . Abdominal Pain  . Chest Pain    HPI Comments: Melissa Clay is a 40 y.o. female with a hx of hypertension and GERD who presents to the Emergency Department complaining of ongoing moderate substernal chest pain that radiates to her back onset yesterday. She reports associated nausea, multiple episodes of non-bloody diarrhea, upper abdominal pain, and generalized body aches. She states that eating makes the pain worse so she has not been able to eat appropriately. No alleviating factors noted. Pt has not tried any medications PTA. She denies any fever, chills, vomiting, shortness of breath, cough, vaginal bleeding, or vaginal discharge.    The history is provided by the patient. No language interpreter was used.    Past Medical History:  Diagnosis Date  . Hypertension     Patient Active Problem List   Diagnosis Date Noted  . Back pain 03/23/2016  . Facet syndrome, lumbar 02/22/2016  . Myofascial pain 02/22/2016  . Migraine without aura and responsive to treatment 08/21/2015  . Gastro-esophageal reflux disease without esophagitis 05/29/2015  . Atypical migraine 12/18/2014  . Benign essential HTN 12/18/2014    Past Surgical History:  Procedure Laterality Date  . TONSILLECTOMY    . TUBAL LIGATION      OB History    No data available       Home Medications    Prior to Admission medications   Medication Sig Start Date End Date Taking? Authorizing Provider  nortriptyline (PAMELOR) 25 MG  capsule Take 1 capsule (25 mg total) by mouth at bedtime. 04/18/16  Yes Lenda Kelp, MD  propranolol (INDERAL) 40 MG tablet Take 40 mg by mouth. 01/28/16  Yes Historical Provider, MD  butalbital-acetaminophen-caffeine (FIORICET WITH CODEINE) 50-325-40-30 MG capsule Take by mouth.    Historical Provider, MD  hydrALAZINE (APRESOLINE) 25 MG tablet Take 25 mg by mouth. 01/28/16   Historical Provider, MD  HYDROcodone-acetaminophen (NORCO/VICODIN) 5-325 MG tablet Take 2 tablets by mouth every 4 (four) hours as needed. 03/10/16   Cheri Fowler, PA-C  methylPREDNISolone (MEDROL DOSEPAK) 4 MG TBPK tablet Follow instructions on package. 03/10/16   Cheri Fowler, PA-C  naproxen (NAPROSYN) 500 MG tablet Take 1 tablet (500 mg total) by mouth 2 (two) times daily. 03/10/16   Cheri Fowler, PA-C  omeprazole (PRILOSEC) 20 MG capsule Take 20 mg by mouth. 01/14/16 01/13/17  Historical Provider, MD    Family History No family history on file.  Social History Social History  Substance Use Topics  . Smoking status: Never Smoker  . Smokeless tobacco: Never Used  . Alcohol use No     Allergies   Lisinopril   Review of Systems Review of Systems  Constitutional: Negative for chills and fever.  Respiratory: Negative for cough and shortness of breath.   Cardiovascular: Positive for chest pain.  Gastrointestinal: Positive for abdominal pain, diarrhea and nausea. Negative for vomiting.  Genitourinary: Negative for vaginal bleeding and vaginal discharge.     Physical Exam Updated Vital Signs BP (!) 162/117   Pulse  72   Temp 97.7 F (36.5 C)   LMP 04/05/2016   SpO2 100%   Physical Exam  Constitutional: She is oriented to person, place, and time. She appears well-developed and well-nourished.  HENT:  Head: Normocephalic and atraumatic.  Eyes: Conjunctivae and EOM are normal. Pupils are equal, round, and reactive to light.  Neck: Normal range of motion. Neck supple.  Cardiovascular: Normal rate and regular rhythm.   Exam reveals no gallop and no friction rub.   No murmur heard. Pulmonary/Chest: Effort normal and breath sounds normal. No respiratory distress. She has no wheezes. She has no rales. She exhibits no tenderness.  CTAB  Abdominal: Soft. Bowel sounds are normal. She exhibits no distension and no mass. There is tenderness. There is no rebound and no guarding.  RUQ moderately tender to palpation with some epigastric abdominal tenderness, no other focal abdominal tenderness  Musculoskeletal: Normal range of motion. She exhibits no edema or tenderness.  Neurological: She is alert and oriented to person, place, and time.  Skin: Skin is warm and dry.  Psychiatric: She has a normal mood and affect. Her behavior is normal. Judgment and thought content normal.  Nursing note and vitals reviewed.    ED Treatments / Results  DIAGNOSTIC STUDIES: Oxygen Saturation is 100% on RA, normal by my interpretation.    COORDINATION OF CARE: 4:59 PM- Pt advised of plan for treatment and pt agrees. Pt will receive US of gall bladder for further evaluation.    Labs (all labs ordered are listed, but only abnormal results are displayed) Labs Reviewed - No data to display  EKG  EKG Interpretation None      ED ECG REPORT  I personally interpreted this EKG   Date: 04/20/2016   Rate: 71  Rhythm: normal sinus rhythm  QRS Axis: normal  Intervals: normal  ST/T Wave abnormalities: normal  Conduction Disutrbances:none  Narrative Interpretation:   Old EKG Reviewed: none available   Radiology No results found.  Procedures Procedures (including critical care time)  Medications Ordered in ED Medications - No data to display   Initial Impression / Assessment and Plan / ED Course  Roxy Horsemanobert Tyaire Odem, PA-C has reviewed the triage vital signs and the nursing notes.  Pertinent labs & imaging results that were available during my care of the patient were reviewed by me and considered in my medical decision making  (see chart for details).  Clinical Course    Patient with vague epigastric/upper abdomen/inferior chest pain.  Symptoms worsen with eating and radiate to back.  Will check LFTs and RUQ US.  No ischemic findings on EKG.  Symptoms do not sound cardiac in nature.  She doesn't have a cough or a fever.  Lung sounds are clear.    Right upper quadrant ultrasound is negative, LFTs not elevated, lipase normal, no leukocytosis, blood counts and electrolytes are all normal.  I discussed the patient with Dr. Donnald GarrePfeiffer, who has reviewed the labs and investigative the chart. Patient has close follow-up. She has been seen multiple times for pain complaints. At this time I do not feel that additional emergent workup is indicated. She has no numbness or weakness on exam.  Dr. Donnald GarrePfeiffer agrees with the plan.    6:48 PM Patient states that she now has a headache.  Will give tylenol prior to discharge.   Final Clinical Impressions(s) / ED Diagnoses   Final diagnoses:  Epigastric pain   I personally performed the services described in this documentation, which was scribed  in my presence. The recorded information has been reviewed and is accurate.     New Prescriptions New Prescriptions   No medications on file     Roxy Horseman, PA-C 04/20/16 1849    Arby Barrette, MD 04/20/16 2325

## 2016-04-20 NOTE — ED Triage Notes (Signed)
Pt having chest/abdominal pain radiating to her back for two days.  Some Nausea and diarrhea.  Eating makes it worse.  Pt denies travel.  Pt states she has some sob with it but no respiratory distress noted.

## 2016-04-20 NOTE — ED Notes (Signed)
PA at bedside.

## 2016-04-20 NOTE — ED Notes (Signed)
Pt given d/c instructions as per chart. Verbalizes understanding. No questions. 

## 2016-04-20 NOTE — ED Notes (Signed)
PA notified of pts continued 10/10 pain.  Pt sts she now also has a migraine and wants med for her migraine and nausea.  Zofran did not help either.  Pt reporting pain in her chest, upper and lower abdomen bilaterally, and in her legs. Room darkened for pts comfort. Pt is resting quietly with a rag over her eyes.

## 2016-04-20 NOTE — ED Notes (Signed)
Patient transported to Ultrasound 

## 2016-04-20 NOTE — ED Notes (Signed)
2 unsuccessful IV attempts.  1 using ultrasound.  Enough blood collected to fill pediatric tubes.  Will take to lab.  Will get another staff to look for IV site.

## 2016-04-21 NOTE — Progress Notes (Addendum)
PCP: No PCP Per Patient  Subjective:   HPI: Patient is a 40 y.o. female here for neck, back pain.  7/10: Patient reports for at least a couple months she has had pain on left side of body. Was afraid she was having a stroke initially. Pain currently gets posterior left neck, upper back, low back. Improved with rest, worse with motion. Pain up to 10/10 and sharp. Now taking naproxen, flexeril with norco as needed. Not much improvement with prednisone dose pack also. Tried chiropractic care. MRI of lumbar spine normal except for a 7cm cyst - recommended f/u with PCP. No bowel/bladder dysfunction. Can radiates down legs and arms at times. No skin changes, current numbness.  8/8: Patient returns reporting continued 10/10 sharp pain especially in low back radiating down legs to her feet. Reports some swelling into left thigh. Difficulty standing or sitting for prolonged periods - worsens pain. No bowel/bladder dysfunction.  Past Medical History:  Diagnosis Date  . Hypertension     Current Outpatient Prescriptions on File Prior to Visit  Medication Sig Dispense Refill  . butalbital-acetaminophen-caffeine (FIORICET WITH CODEINE) 50-325-40-30 MG capsule Take by mouth.    . hydrALAZINE (APRESOLINE) 25 MG tablet Take 25 mg by mouth.    Marland Kitchen HYDROcodone-acetaminophen (NORCO/VICODIN) 5-325 MG tablet Take 2 tablets by mouth every 4 (four) hours as needed. 12 tablet 0  . methylPREDNISolone (MEDROL DOSEPAK) 4 MG TBPK tablet Follow instructions on package. 21 tablet 0  . naproxen (NAPROSYN) 500 MG tablet Take 1 tablet (500 mg total) by mouth 2 (two) times daily. 30 tablet 0  . omeprazole (PRILOSEC) 20 MG capsule Take 20 mg by mouth.    . propranolol (INDERAL) 40 MG tablet Take 40 mg by mouth.     No current facility-administered medications on file prior to visit.     Past Surgical History:  Procedure Laterality Date  . TONSILLECTOMY    . TUBAL LIGATION      Allergies  Allergen  Reactions  . Lisinopril Shortness Of Breath    Social History   Social History  . Marital status: Single    Spouse name: N/A  . Number of children: N/A  . Years of education: N/A   Occupational History  . Not on file.   Social History Main Topics  . Smoking status: Never Smoker  . Smokeless tobacco: Never Used  . Alcohol use No  . Drug use: No  . Sexual activity: Not on file   Other Topics Concern  . Not on file   Social History Narrative  . No narrative on file    No family history on file.  BP (!) 170/120   Pulse 81   Ht  (1.575 m)   Wt 152 lb (68.9 kg)   BMI 27.80 kg/m   Review of Systems: See HPI above.    Objective:  Physical Exam:  Gen: NAD, comfortable in exam room  Neck: No gross deformity, swelling, bruising. TTP left > right paraspinal cervical regions.  No midline/bony TTP. FROM neck - pain on flexion, extension. BUE strength 5/5.   Sensation intact to light touch.   NV intact distal BUEs.  Back: No gross deformity, scoliosis. TTP left > right paraspinal lumbar regions.  No midline or bony TTP. FROM. Strength LEs 5/5 all muscle groups.   2+ MSRs in patellar and achilles tendons, equal bilaterally. Negative SLRs. Sensation intact to light touch bilaterally. Negative logroll bilateral hips Negative fabers and piriformis stretches.    Assessment &  Plan:  1. Neck, back pain - exam is reassuring.  Severity is 10/10.  Some description is non anatomic (radiation into both legs) or not present - noted no swelling in left thigh at least currently.  MRI of lumbar spine is normal and reassuring.  S/p steroid dose pack, nsaids, muscle relaxants, pain medication, chiropractic care.  From an orthopedic standpoint we had discussed cervical spine MRI which would complete our evaluation - will go ahead with this.  I would put consideration into a possible rheumatologic cause, myofascial pain, or somatization disorder if this is normal.    Addendum:   MRI reviewed of cervical spine and discussed with patient - nothing noted here to account for her pain, symptoms.  She noted her PCP has discussed evaluation for fibromyalgia - I encouraged her to go through with this, request referral from them.  This completes the orthopedic workup of her symptoms.  Also advised her to take FMLA paperwork to them - no orthopedic diagnosis to warrant out of work time, missed days.

## 2016-04-21 NOTE — Assessment & Plan Note (Signed)
exam is reassuring.  Severity is 10/10.  Some description is non anatomic (radiation into both legs) or not present - noted no swelling in left thigh at least currently.  MRI of lumbar spine is normal and reassuring.  S/p steroid dose pack, nsaids, muscle relaxants, pain medication, chiropractic care.  From an orthopedic standpoint we had discussed cervical spine MRI which would complete our evaluation - will go ahead with this.  I would put consideration into a possible rheumatologic cause, myofascial pain, or somatization disorder if this is normal.

## 2016-04-26 ENCOUNTER — Ambulatory Visit: Payer: Medicaid Other | Admitting: Family Medicine

## 2016-04-30 ENCOUNTER — Ambulatory Visit (HOSPITAL_BASED_OUTPATIENT_CLINIC_OR_DEPARTMENT_OTHER)
Admission: RE | Admit: 2016-04-30 | Discharge: 2016-04-30 | Disposition: A | Discharge status: Home / Self Care | Payer: PRIVATE HEALTH INSURANCE | Source: Ambulatory Visit | Attending: Family Medicine | Admitting: Family Medicine

## 2016-04-30 DIAGNOSIS — M542 Cervicalgia: Secondary | ICD-10-CM | POA: Diagnosis present

## 2016-04-30 DIAGNOSIS — M50222 Other cervical disc displacement at C5-C6 level: Secondary | ICD-10-CM | POA: Insufficient documentation

## 2016-05-06 ENCOUNTER — Telehealth: Payer: Self-pay | Admitting: Family Medicine

## 2016-05-06 NOTE — Telephone Encounter (Signed)
Called patient 8/21 afternoon to provide MRI results and to ask about FMLA paperwork she dropped off without instructions (she was only written out of work for a couple days, typically paperwork not needed for this short of a period).  Left her a voicemail to call us back.

## 2016-05-13 NOTE — Telephone Encounter (Signed)
Had not heard back - called her and left another voicemail.

## 2016-05-17 ENCOUNTER — Telehealth: Payer: Self-pay | Admitting: Family Medicine

## 2016-05-20 NOTE — Telephone Encounter (Signed)
Called patient yesterday and left voicemail.

## 2016-05-23 NOTE — Telephone Encounter (Signed)
Spoke with patient on 9/8 - will put an addendum into her chart.

## 2018-06-09 IMAGING — DX DG ORBITS FOR FOREIGN BODY
2 series · 2 of 2 positions shown · non-contrast
Comparison: None.

CLINICAL DATA: Metal working/exposure; clearance prior to MRI

EXAM:
ORBITS FOR FOREIGN BODY - 2 VIEW

[orbits waters (1 of 2)]
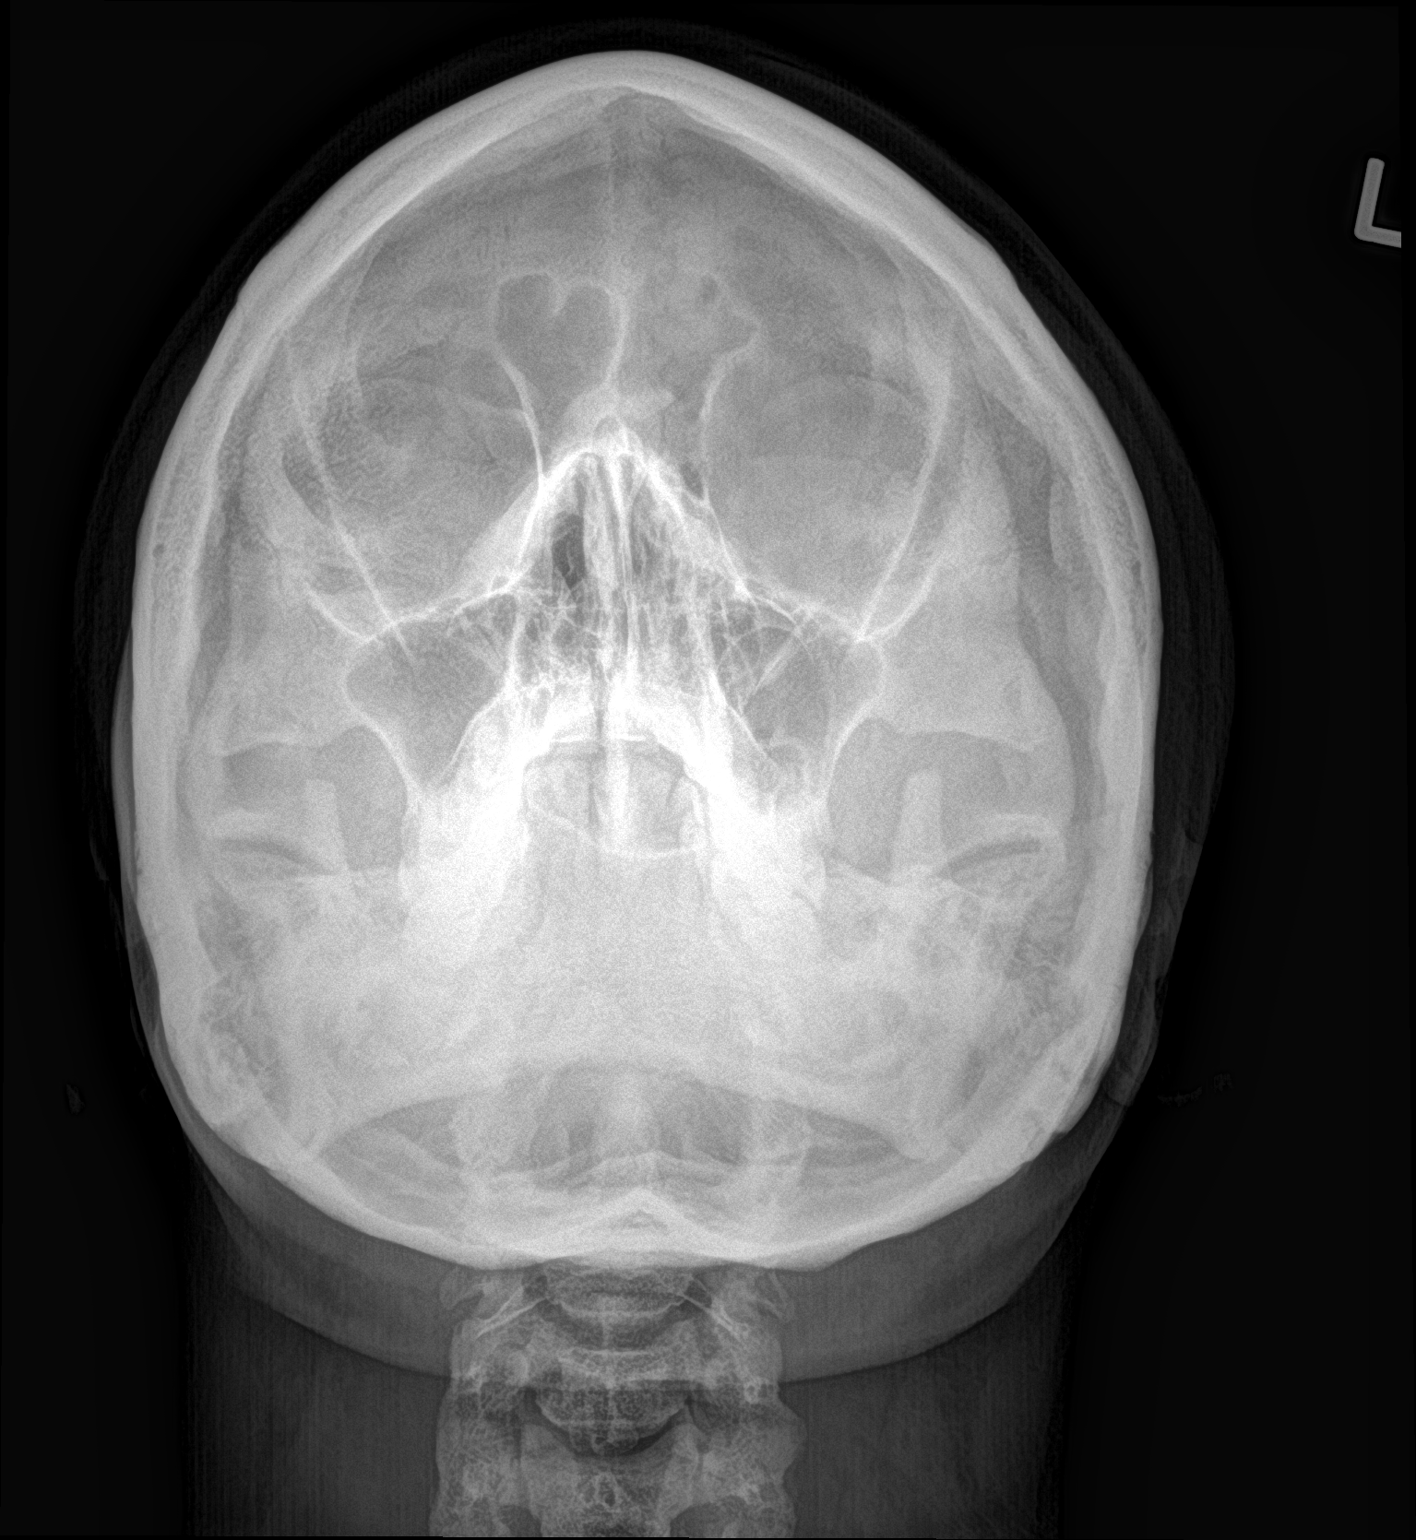

[orbits waters (2 of 2)]
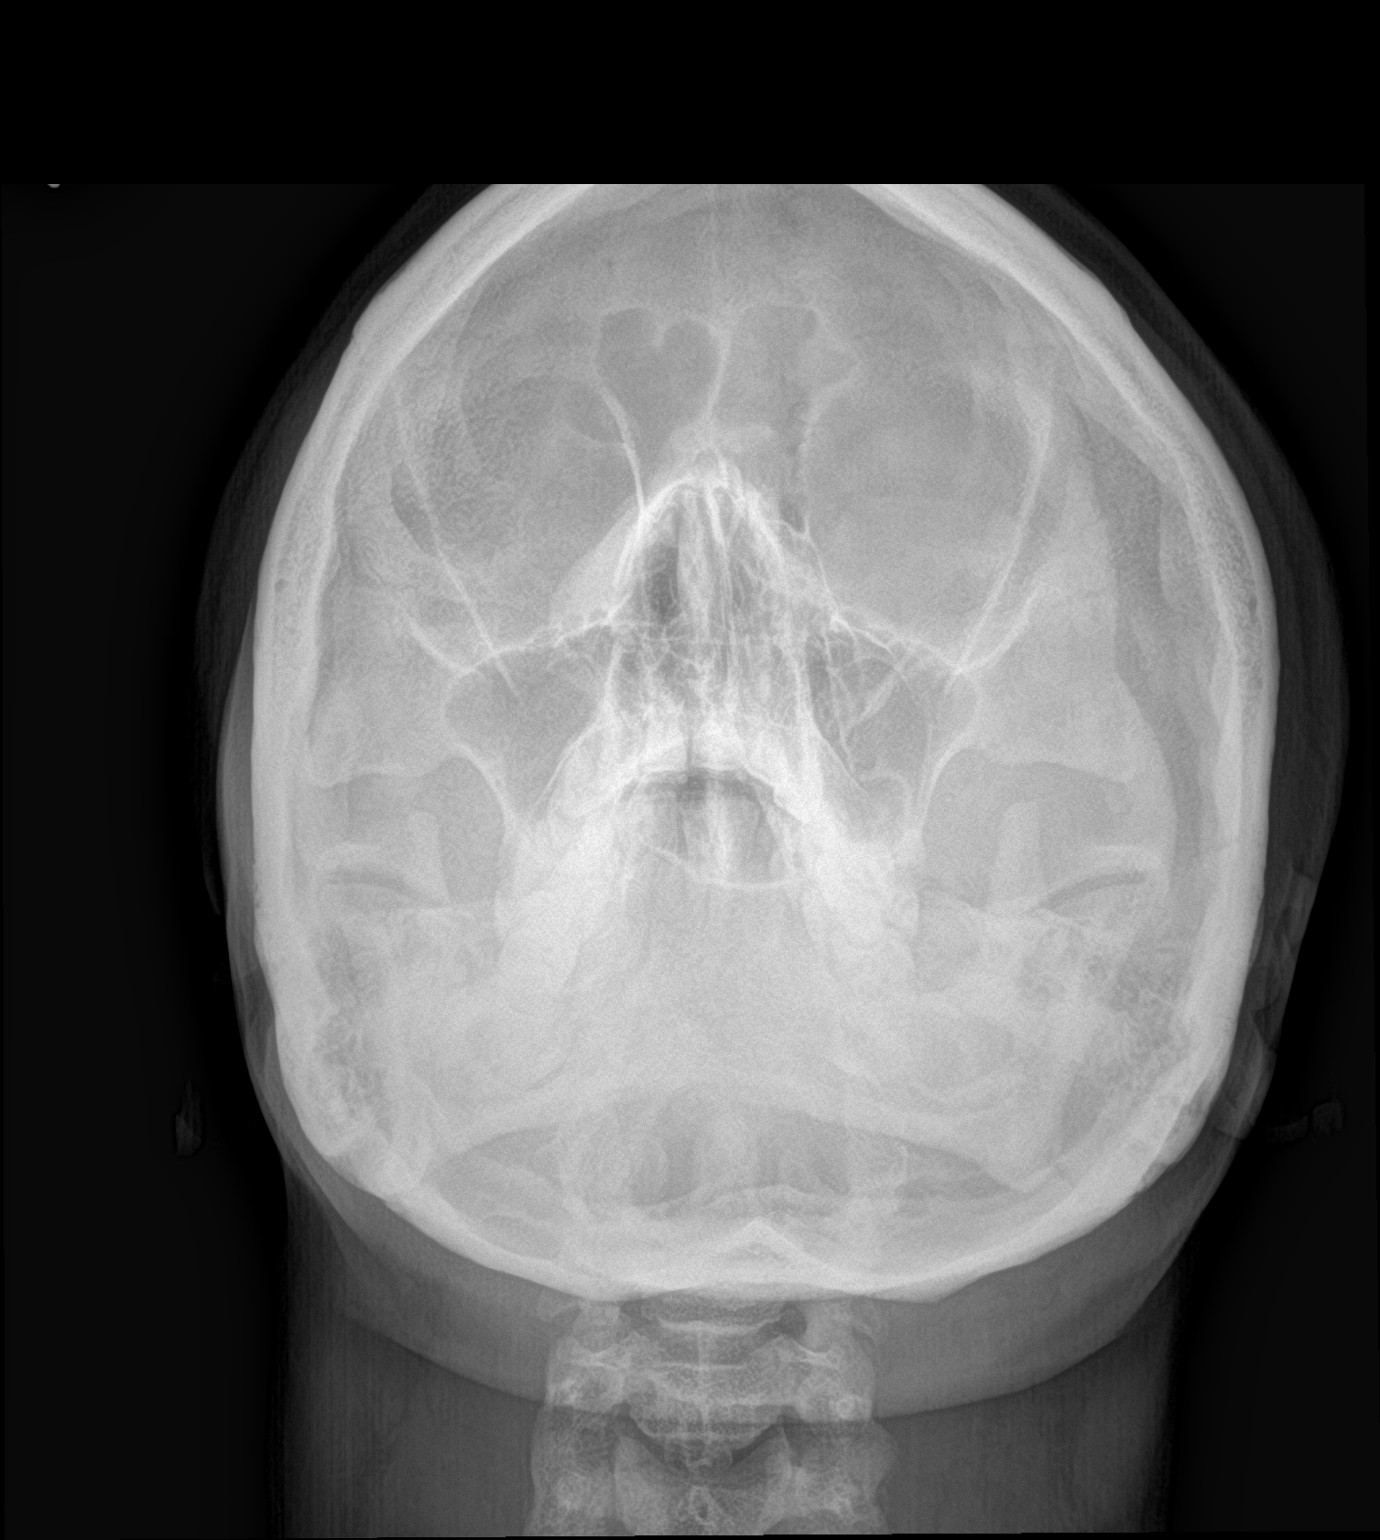

[2 of 2 positions shown; findings below may reference images not displayed]

FINDINGS: There is no evidence of metallic foreign body within the orbits. No
significant bone abnormality identified.
IMPRESSION: No evidence of metallic foreign body within the orbits.

## 2019-09-15 ENCOUNTER — Other Ambulatory Visit: Payer: Self-pay

## 2019-09-15 ENCOUNTER — Encounter (HOSPITAL_BASED_OUTPATIENT_CLINIC_OR_DEPARTMENT_OTHER): Payer: Self-pay | Admitting: Emergency Medicine

## 2019-09-15 ENCOUNTER — Emergency Department (HOSPITAL_BASED_OUTPATIENT_CLINIC_OR_DEPARTMENT_OTHER)
Admission: EM | Admit: 2019-09-15 | Discharge: 2019-09-15 | Disposition: A | Discharge status: Home / Self Care | Payer: PRIVATE HEALTH INSURANCE | Attending: Emergency Medicine | Admitting: Emergency Medicine

## 2019-09-15 DIAGNOSIS — Z79899 Other long term (current) drug therapy: Secondary | ICD-10-CM | POA: Insufficient documentation

## 2019-09-15 DIAGNOSIS — M79601 Pain in right arm: Secondary | ICD-10-CM

## 2019-09-15 DIAGNOSIS — Z888 Allergy status to other drugs, medicaments and biological substances status: Secondary | ICD-10-CM | POA: Diagnosis not present

## 2019-09-15 DIAGNOSIS — M79602 Pain in left arm: Secondary | ICD-10-CM | POA: Insufficient documentation

## 2019-09-15 DIAGNOSIS — I10 Essential (primary) hypertension: Secondary | ICD-10-CM | POA: Diagnosis not present

## 2019-09-15 DIAGNOSIS — G8929 Other chronic pain: Secondary | ICD-10-CM | POA: Diagnosis not present

## 2019-09-15 MED ORDER — CELECOXIB 200 MG PO CAPS: 200.0000 mg | ORAL_CAPSULE | Freq: Two times a day (BID) | ORAL | 0 refills | Status: AC

## 2019-09-15 MED ORDER — METHYLPREDNISOLONE 4 MG PO TBPK: ORAL_TABLET | ORAL | 0 refills | Status: AC

## 2019-09-15 NOTE — ED Notes (Signed)
Pt states she feels tingling and pain down into left hand in addition to arm and neck pain

## 2019-09-15 NOTE — Discharge Instructions (Addendum)
Contact a health care provider if: °Your pain and other symptoms get worse. °Your pain medicine is not helping. °Your pain has not improved after a few weeks of home care. °You have a fever. °Get help right away if: °You have severe pain, weakness, or numbness. °You have difficulty with bladder or bowel control. °

## 2019-09-15 NOTE — ED Provider Notes (Signed)
[facility] EMERGENCY DEPARTMENT Provider Note   CSN: [id number] Arrival date & time: 09/15/19  1341     History Chief Complaint  Patient presents with  . Arm Pain    Melissa Clay is a 44 y.o. female with a pmh of chronic L arm pain.  She complains of pain throughout her entire left arm, left shoulder blade.  She has numbness and tingling in her fingers.  She feels like her hand is always very cold.  She has seen a hand specialist and is scheduled for an MRI.  She has been taking without significant relief and states that her pain is worse which brought her in.  She feels like there is some swelling in the left thenar eminence.  She has pain with movement of her thumb which appears new.  She denies any new weakness in the arm.  HPI     Past Medical History:  Diagnosis Date  . Hypertension     Patient Active Problem List   Diagnosis Date Noted  . Back pain 03/23/2016  . Facet syndrome, lumbar 02/22/2016  . Myofascial pain 02/22/2016  . Migraine without aura and responsive to treatment 08/21/2015  . Gastro-esophageal reflux disease without esophagitis 05/29/2015  . Atypical migraine 12/18/2014  . Benign essential HTN 12/18/2014    Past Surgical History:  Procedure Laterality Date  . ABDOMINAL HYSTERECTOMY    . TONSILLECTOMY    . TUBAL LIGATION       OB History   No obstetric history on file.     No family history on file.  Social History   Tobacco Use  . Smoking status: Never Smoker  . Smokeless tobacco: Never Used  Substance Use Topics  . Alcohol use: No    Alcohol/week: 0.0 standard drinks  . Drug use: No    Home Medications Prior to Admission medications   Medication Sig Start Date End Date Taking? Authorizing Provider  chlorthalidone (HYGROTON) 25 MG tablet TAKE ONE-HALF TABLET BY MOUTH EVERY DAY 06/13/19  Yes [provider]  butalbital-acetaminophen-caffeine (FIORICET WITH CODEINE) 50-325-40-30 MG capsule Take by mouth.     [provider]  celecoxib (CELEBREX) 200 MG capsule Take 1 capsule (200 mg total) by mouth 2 (two) times daily. 09/15/19   Arthor Captain, PA-C  hydrALAZINE (APRESOLINE) 25 MG tablet Take 25 mg by mouth. 01/28/16   [provider]  methylPREDNISolone (MEDROL DOSEPAK) 4 MG TBPK tablet Use as directed 09/15/19   Arthor Captain, PA-C  nortriptyline (PAMELOR) 25 MG capsule Take 1 capsule (25 mg total) by mouth at bedtime. 04/18/16   Hudnall, Azucena Fallen, MD  omeprazole (PRILOSEC) 20 MG capsule Take 20 mg by mouth. 01/14/16 01/13/17  [provider]  propranolol (INDERAL) 40 MG tablet Take 40 mg by mouth. 01/28/16   [provider]    Allergies    Lisinopril  Review of Systems   Review of Systems Ten systems reviewed and are negative for acute change, except as noted in the HPI.   Physical Exam Updated Vital Signs BP (!) 137/95 (BP Location: Right Arm)   Pulse 89   Temp 99.3 F (37.4 C) (Oral)   Resp 14   Ht 5\' 2"  (1.575 m)   Wt 74.4 kg   LMP 04/05/2016   SpO2 97%   BMI 30.00 kg/m   Physical Exam Vitals and nursing note reviewed.  Constitutional:      General: She is not in acute distress.    Appearance: She is well-developed.  She is not diaphoretic.  HENT:     Head: Normocephalic and atraumatic.  Eyes:     General: No scleral icterus.    Conjunctiva/sclera: Conjunctivae normal.  Cardiovascular:     Rate and Rhythm: Normal rate and regular rhythm.     Heart sounds: Normal heart sounds. No murmur. No friction rub. No gallop.   Pulmonary:     Effort: Pulmonary effort is normal. No respiratory distress.     Breath sounds: Normal breath sounds.  Abdominal:     General: Bowel sounds are normal. There is no distension.     Palpations: Abdomen is soft. There is no mass.     Tenderness: There is no abdominal tenderness. There is no guarding.  Musculoskeletal:     Cervical back: Normal range of motion.     Comments: Left arm tender to palpation.  No  obvious swelling, normal radial pulse.  Minimal swelling of the thenar eminence on the left with tenderness along the flexor tendon of the left thumb.  No catching with movement of the thumb.  Decreased range of motion due to pain.  Skin:    General: Skin is warm and dry.  Neurological:     Mental Status: She is alert and oriented to person, place, and time.  Psychiatric:        Behavior: Behavior normal.     ED Results / Procedures / Treatments   Labs (all labs ordered are listed, but only abnormal results are displayed) Labs Reviewed - No data to display  EKG None  Radiology No results found.  Procedures Procedures (including critical care time)  Medications Ordered in ED Medications - No data to display  ED Course  I have reviewed the triage vital signs and the nursing notes.  Pertinent labs & imaging results that were available during my care of the patient were reviewed by me and considered in my medical decision making (see chart for details).    MDM Rules/Calculators/A&P                      Patient with chronic left arm pain.  Differential includes radiculopathy, complex regional pain syndrome, she may also have a new tenosynovitis.  Patient has a thumb spica splint which she wears.  Will discharge patient with Celebrex and Medrol Dosepak.  She is advised to continue with her current pain plan follow closely with her specialist.  Discussed return precautions. Final Clinical Impression(s) / ED Diagnoses Final diagnoses:  Right arm pain    Rx / DC Orders ED Discharge Orders         Ordered    methylPREDNISolone (MEDROL DOSEPAK) 4 MG TBPK tablet     09/15/19 1755    celecoxib (CELEBREX) 200 MG capsule  2 times daily     09/15/19 1755           Margarita Mail, PA-C 09/15/19 1822    Wyvonnia Dusky, MD 09/16/19 1247

## 2019-09-15 NOTE — ED Triage Notes (Signed)
L arm pain radiating into the neck for months. Is out of her meloxicam. Has an MRI pending.

## 2021-04-20 ENCOUNTER — Other Ambulatory Visit: Payer: Self-pay

## 2021-04-20 ENCOUNTER — Other Ambulatory Visit (HOSPITAL_BASED_OUTPATIENT_CLINIC_OR_DEPARTMENT_OTHER): Payer: Self-pay

## 2021-04-20 ENCOUNTER — Emergency Department (HOSPITAL_BASED_OUTPATIENT_CLINIC_OR_DEPARTMENT_OTHER): Payer: PRIVATE HEALTH INSURANCE

## 2021-04-20 ENCOUNTER — Emergency Department (HOSPITAL_BASED_OUTPATIENT_CLINIC_OR_DEPARTMENT_OTHER)
Admission: EM | Admit: 2021-04-20 | Discharge: 2021-04-20 | Disposition: A | Discharge status: Home / Self Care | Payer: PRIVATE HEALTH INSURANCE | Attending: Emergency Medicine | Admitting: Emergency Medicine

## 2021-04-20 ENCOUNTER — Encounter (HOSPITAL_BASED_OUTPATIENT_CLINIC_OR_DEPARTMENT_OTHER): Payer: Self-pay

## 2021-04-20 DIAGNOSIS — I10 Essential (primary) hypertension: Secondary | ICD-10-CM | POA: Insufficient documentation

## 2021-04-20 DIAGNOSIS — Z79899 Other long term (current) drug therapy: Secondary | ICD-10-CM | POA: Insufficient documentation

## 2021-04-20 DIAGNOSIS — R519 Headache, unspecified: Secondary | ICD-10-CM | POA: Diagnosis present

## 2021-04-20 HISTORY — DX: Other intervertebral disc degeneration, lumbar region: M51.36

## 2021-04-20 HISTORY — DX: Other intervertebral disc degeneration, lumbar region without mention of lumbar back pain or lower extremity pain: M51.369

## 2021-04-20 HISTORY — DX: Fibromyalgia: M79.7

## 2021-04-20 LAB — CBC WITH DIFFERENTIAL/PLATELET
Abs Immature Granulocytes: 0.01 10*3/uL (ref 0.00–0.07)
Basophils Absolute: 0 10*3/uL (ref 0.0–0.1)
Basophils Relative: 1 %
Eosinophils Absolute: 0.1 10*3/uL (ref 0.0–0.5)
Eosinophils Relative: 1 %
HCT: 40.4 % (ref 36.0–46.0)
Hemoglobin: 13.1 g/dL (ref 12.0–15.0)
Immature Granulocytes: 0 %
Lymphocytes Relative: 32 %
Lymphs Abs: 2 10*3/uL (ref 0.7–4.0)
MCH: 26.8 pg (ref 26.0–34.0)
MCHC: 32.4 g/dL (ref 30.0–36.0)
MCV: 82.6 fL (ref 80.0–100.0)
Monocytes Absolute: 0.6 10*3/uL (ref 0.1–1.0)
Monocytes Relative: 9 %
Neutro Abs: 3.7 10*3/uL (ref 1.7–7.7)
Neutrophils Relative %: 57 %
Platelets: 304 10*3/uL (ref 150–400)
RBC: 4.89 MIL/uL (ref 3.87–5.11)
RDW: 14.1 % (ref 11.5–15.5)
WBC: 6.4 10*3/uL (ref 4.0–10.5)
nRBC: 0 % (ref 0.0–0.2)

## 2021-04-20 LAB — BASIC METABOLIC PANEL
Anion gap: 10 (ref 5–15)
BUN: 18 mg/dL (ref 6–20)
CO2: 26 mmol/L (ref 22–32)
Calcium: 9.3 mg/dL (ref 8.9–10.3)
Chloride: 102 mmol/L (ref 98–111)
Creatinine, Ser: 0.88 mg/dL (ref 0.44–1.00)
GFR, Estimated: >60 mL/min (ref >60)
Glucose, Bld: 82 mg/dL (ref 70–99)
Potassium: 4.2 mmol/L (ref 3.5–5.1)
Sodium: 138 mmol/L (ref 135–145)

## 2021-04-20 LAB — TROPONIN I (HIGH SENSITIVITY): Troponin I (High Sensitivity): 3 ng/L (ref <18)

## 2021-04-20 MED ORDER — KETOROLAC TROMETHAMINE 30 MG/ML IJ SOLN
30.0000 mg | Freq: Once | INTRAMUSCULAR | Status: AC
  Administered 2021-04-20: 30 mg via INTRAVENOUS
  Filled 2021-04-20: qty 1

## 2021-04-20 MED ORDER — AMLODIPINE BESYLATE 5 MG PO TABS: 5.0000 mg | ORAL_TABLET | Freq: Every day | ORAL | 0 refills | Status: AC

## 2021-04-20 MED ORDER — AMITRIPTYLINE HCL 25 MG PO TABS
ORAL_TABLET | ORAL | 2 refills | Status: AC
Start: 2021-04-20 — End: ?
  Filled 2021-04-20: qty 120, 30d supply, fill #0

## 2021-04-20 MED ORDER — TIZANIDINE HCL 4 MG PO TABS
ORAL_TABLET | ORAL | 2 refills | Status: DC
  Filled 2021-04-20: qty 90, 30d supply, fill #0

## 2021-04-20 NOTE — ED Provider Notes (Signed)
[facility] EMERGENCY DEPARTMENT Provider Note   CSN: [id number] Arrival date & time: 04/20/21  1606     History Chief Complaint  Patient presents with   Hypertension    Melissa Clay is a 45 y.o. female with PMHx HTN, Fibromyalgia, DDD who presents to the ED today from a management office for high blood pressure.  She states that she has been having issues with her back and went to her pain management office today and was noted to have an elevated blood pressure in the 180s.  She states that she was on blood pressure medication several months ago however was taken off as she felt like her hair was falling out due to same.  She states that she did go to urgent care last week and was noted to have an elevated blood pressure and admits that she does not check her blood pressure on a regular basis.  He mentions that she has been having intermittent headaches recently however states history of migraines.  She states that the headaches that she has been having over the past week or 2 are much less severe than her normal migraine headache.  She does also state that she began having some tingling in her left arm today, no chest pain.  She states that it lasted a couple of hours and then dissipated.  No weakness or numbness.  No other complaints at this time.   The history is provided by the patient and medical records.      Past Medical History:  Diagnosis Date   DDD (degenerative disc disease), lumbar    Fibromyalgia muscle pain    Hypertension     Patient Active Problem List   Diagnosis Date Noted   Back pain 03/23/2016   Facet syndrome, lumbar 02/22/2016   Myofascial pain 02/22/2016   Migraine without aura and responsive to treatment 08/21/2015   Gastro-esophageal reflux disease without esophagitis 05/29/2015   Atypical migraine 12/18/2014   Benign essential HTN 12/18/2014    Past Surgical History:  Procedure Laterality Date   ABDOMINAL HYSTERECTOMY     TONSILLECTOMY      TUBAL LIGATION       OB History   No obstetric history on file.     History reviewed. No pertinent family history.  Social History   Tobacco Use   Smoking status: Never   Smokeless tobacco: Never  Substance Use Topics   Alcohol use: No    Alcohol/week: 0.0 standard drinks   Drug use: No    Home Medications Prior to Admission medications   Medication Sig Start Date End Date Taking? Authorizing Provider  amLODipine (NORVASC) 5 MG tablet Take 1 tablet (5 mg total) by mouth daily. 04/20/21 05/20/21 Yes Tinsley Everman, PA-C  amitriptyline (ELAVIL) 25 MG tablet Take 4 tablets (100 mg total) by mouth daily for 30 days. 04/20/21     butalbital-acetaminophen-caffeine (FIORICET WITH CODEINE) 50-325-40-30 MG capsule Take by mouth.    [provider]  celecoxib (CELEBREX) 200 MG capsule Take 1 capsule (200 mg total) by mouth 2 (two) times daily. 09/15/19   Harris, Abigail, PA-C  chlorthalidone (HYGROTON) 25 MG tablet TAKE ONE-HALF TABLET BY MOUTH EVERY DAY 06/13/19   [provider]  hydrALAZINE (APRESOLINE) 25 MG tablet Take 25 mg by mouth. 01/28/16   [provider]  methylPREDNISolone (MEDROL DOSEPAK) 4 MG TBPK tablet Use as directed 09/15/19   Arthor Captain, PA-C  nortriptyline (PAMELOR) 25 MG capsule Take 1 capsule (25 mg total) by  mouth at bedtime. 04/18/16   Hudnall, Azucena Fallen, MD  omeprazole (PRILOSEC) 20 MG capsule Take 20 mg by mouth. 01/14/16 01/13/17  [provider]  propranolol (INDERAL) 40 MG tablet Take 40 mg by mouth. 01/28/16   [provider]  tiZANidine (ZANAFLEX) 4 MG tablet Take 1 tablet by mouth 3 times a day as needed for up to 30 days for muscle spasms 04/20/21       Allergies    Lisinopril  Review of Systems   Review of Systems  Constitutional:  Negative for fever.  Respiratory:  Negative for shortness of breath.   Cardiovascular:  Negative for chest pain.  Musculoskeletal:  Positive for back pain (chronic).  Neurological:   Positive for light-headedness and headaches. Negative for weakness and numbness.  All other systems reviewed and are negative.  Physical Exam Updated Vital Signs BP (!) 165/97   Pulse 74   Temp 98.2 F (36.8 C) (Oral)   Resp 14   Ht 5\' 2"  (1.575 m)   Wt 71.7 kg   LMP 04/05/2016   SpO2 100%   BMI 28.90 kg/m   Physical Exam Vitals and nursing note reviewed.  Constitutional:      Appearance: She is not ill-appearing or diaphoretic.  HENT:     Head: Normocephalic and atraumatic.  Eyes:     Extraocular Movements: Extraocular movements intact.     Conjunctiva/sclera: Conjunctivae normal.     Pupils: Pupils are equal, round, and reactive to light.  Cardiovascular:     Rate and Rhythm: Normal rate and regular rhythm.     Pulses: Normal pulses.  Pulmonary:     Effort: Pulmonary effort is normal.     Breath sounds: Normal breath sounds. No wheezing, rhonchi or rales.  Abdominal:     Palpations: Abdomen is soft.     Tenderness: There is no abdominal tenderness.  Musculoskeletal:     Cervical back: Neck supple.  Skin:    General: Skin is warm and dry.  Neurological:     Mental Status: She is alert.     Comments: Alert and oriented to self, place, time and event.   Speech is fluent, clear without dysarthria or dysphasia.   Strength 5/5 in upper/lower extremities   Sensation intact in upper/lower extremities   Normal gait.  Negative Romberg. No pronator drift.  Normal finger-to-nose and feet tapping.  CN I not tested  CN II grossly intact visual fields bilaterally. Did not visualize posterior eye.  CN III, IV, VI PERRLA and EOMs intact bilaterally  CN V Intact sensation to sharp and light touch to the face  CN VII facial movements symmetric  CN VIII not tested  CN IX, X no uvula deviation, symmetric rise of soft palate  CN XI 5/5 SCM and trapezius strength bilaterally  CN XII Midline tongue protrusion, symmetric L/R movements      ED Results / Procedures / Treatments    Labs (all labs ordered are listed, but only abnormal results are displayed) Labs Reviewed  BASIC METABOLIC PANEL  CBC WITH DIFFERENTIAL/PLATELET  TROPONIN I (HIGH SENSITIVITY)  TROPONIN I (HIGH SENSITIVITY)    EKG EKG Interpretation  Date/Time:  Tuesday April 20 2021 17:35:46 EDT Ventricular Rate:  81 PR Interval:  149 QRS Duration: 88 QT Interval:  366 QTC Calculation: 425 R Axis:   87 Text Interpretation: Sinus rhythm No significant change since last tracing Confirmed by [id number] [phone]) on 04/20/2021 5:46:25 PM  Radiology CT Head Wo Contrast  Result Date: 04/20/2021 CLINICAL DATA:  Headaches, elevated blood pressure EXAM: CT HEAD WITHOUT CONTRAST TECHNIQUE: Contiguous axial images were obtained from the base of the skull through the vertex without intravenous contrast. COMPARISON:  None. FINDINGS: Brain: No acute intracranial hemorrhage. No focal mass lesion. No CT evidence of acute infarction. No midline shift or mass effect. No hydrocephalus. Basilar cisterns are patent. Vascular: No hyperdense vessel or unexpected calcification. Skull: Normal. Negative for fracture or focal lesion. Sinuses/Orbits: Paranasal sinuses and mastoid air cells are clear. Orbits are clear. Other: None. IMPRESSION: No acute intracranial findings. Electronically Signed   By: Genevive BiStewart  Edmunds M.D.   On: 04/20/2021 18:46    Procedures Procedures   Medications Ordered in ED Medications  ketorolac (TORADOL) 30 MG/ML injection 30 mg (has no administration in time range)    ED Course  I have reviewed the triage vital signs and the nursing notes.  Pertinent labs & imaging results that were available during my care of the patient were reviewed by me and considered in my medical decision making (see chart for details).    MDM Rules/Calculators/A&P                           45 year old female who presents to the ED today for high blood pressure.  Was noted to have elevated blood pressure  readings at her pain management office today and sent here for further evaluation.  History of same however was taken off of her medications a couple of months ago.  On arrival to the ED patient's initial pressure reading was 145/129 with repeat at 185/108, 168/97, 171/95, 164/99.  She mentions intermittent headaches as well as a tingling sensation in her left arm today ( since resolved). She has no focal neurodeficits on exam today.  She denies any chest pain or shortness of breath.  Will work-up for endorgan damage related to high blood pressure as well as plan for CT head given complains of headaches and elevated blood pressure readings.  Question if her blood pressure is causing headaches versus her chronic back pain is causing elevated blood pressure.  Difficult to say at this time.  If work-up unremarkable will plan to discharge home with PCP follow-up, will likely need to place patient back on blood pressure medication.  EKG without acute ischemic changes CBC without leukocytosis, hgb stable at 13.1 BMP without electrolyte abnormalities. Creatinine stable at 0.88 Troponin 3 CT Head negative  Blood pressure has stayed stable in the 160s/90s in the ED today. Do suspect she needs to be back on BP meds long term. She is not interested in going back to chlorthalidone and propranolol because she feels like it caused hairloss; will start on low dose amlodipine for same. Pt encouraged to keep log of BP readings and PCP follow up. Stable for discharge at this time.   This note was prepared using Dragon voice recognition software and may include unintentional dictation errors due to the inherent limitations of voice recognition software.   Final Clinical Impression(s) / ED Diagnoses Final diagnoses:  Primary hypertension    Rx / DC Orders ED Discharge Orders          Ordered    amLODipine (NORVASC) 5 MG tablet  Daily        04/20/21 1858             Discharge Instructions      Your  workup was overall reassuring in the ED today  Please pick up blood pressure medication and take daily as prescribed. I would recommend keeping a log of your blood pressure readings to take to your PCP when you see them again (they will need to represcribe blood pressure medications to you).        Tanda Rockers, PA-C 04/20/21 1901    Jacalyn Lefevre, MD 04/20/21 2120

## 2021-04-20 NOTE — Discharge Instructions (Addendum)
Your workup was overall reassuring in the ED today  Please pick up blood pressure medication and take daily as prescribed. I would recommend keeping a log of your blood pressure readings to take to your PCP when you see them again (they will need to represcribe blood pressure medications to you).

## 2021-04-20 NOTE — ED Triage Notes (Signed)
"  Went to see my pain management doctor and my Blood Pressure was really high so he called my PCP and they told me to come to the ED" per pt   Was taken off of BP meds a couple of months ago per pt

## 2021-04-20 NOTE — ED Notes (Signed)
Patient states her PCP took her off her HTN medication due to side effects. Patient states she was losing weight and her hair was falling out. Patient was also recently diagnosed with DDD, arthritis of the spine, fibromyalgia.  Patient reports posterior headache and blurry vision and states her left arm was hurting but is no longer hurting. Denies chest pain but says she is tired.

## 2021-04-27 ENCOUNTER — Other Ambulatory Visit (HOSPITAL_BASED_OUTPATIENT_CLINIC_OR_DEPARTMENT_OTHER): Payer: Self-pay

## 2021-05-06 ENCOUNTER — Other Ambulatory Visit (HOSPITAL_BASED_OUTPATIENT_CLINIC_OR_DEPARTMENT_OTHER): Payer: Self-pay

## 2021-05-18 ENCOUNTER — Other Ambulatory Visit (HOSPITAL_BASED_OUTPATIENT_CLINIC_OR_DEPARTMENT_OTHER): Payer: Self-pay

## 2021-05-18 MED ORDER — AMITRIPTYLINE HCL 25 MG PO TABS
ORAL_TABLET | ORAL | 3 refills | Status: AC
  Filled 2021-05-18 – 2021-05-31 (×2): qty 120, 30d supply, fill #0

## 2021-05-31 ENCOUNTER — Other Ambulatory Visit (HOSPITAL_BASED_OUTPATIENT_CLINIC_OR_DEPARTMENT_OTHER): Payer: Self-pay

## 2021-06-03 ENCOUNTER — Other Ambulatory Visit (HOSPITAL_BASED_OUTPATIENT_CLINIC_OR_DEPARTMENT_OTHER): Payer: Self-pay

## 2021-06-22 ENCOUNTER — Other Ambulatory Visit (HOSPITAL_BASED_OUTPATIENT_CLINIC_OR_DEPARTMENT_OTHER): Payer: Self-pay

## 2021-06-22 MED ORDER — AMOXICILLIN-POT CLAVULANATE 875-125 MG PO TABS
ORAL_TABLET | ORAL | 0 refills | Status: AC
  Filled 2021-06-22: qty 14, 7d supply, fill #0

## 2021-11-30 ENCOUNTER — Emergency Department (HOSPITAL_BASED_OUTPATIENT_CLINIC_OR_DEPARTMENT_OTHER)
Admission: EM | Admit: 2021-11-30 | Discharge: 2021-11-30 | Disposition: A | Discharge status: Home / Self Care | Payer: No Typology Code available for payment source | Attending: Emergency Medicine | Admitting: Emergency Medicine

## 2021-11-30 ENCOUNTER — Emergency Department (HOSPITAL_BASED_OUTPATIENT_CLINIC_OR_DEPARTMENT_OTHER): Payer: No Typology Code available for payment source

## 2021-11-30 ENCOUNTER — Other Ambulatory Visit: Payer: Self-pay

## 2021-11-30 ENCOUNTER — Encounter (HOSPITAL_BASED_OUTPATIENT_CLINIC_OR_DEPARTMENT_OTHER): Payer: Self-pay

## 2021-11-30 DIAGNOSIS — S199XXA Unspecified injury of neck, initial encounter: Secondary | ICD-10-CM | POA: Insufficient documentation

## 2021-11-30 DIAGNOSIS — Y92410 Unspecified street and highway as the place of occurrence of the external cause: Secondary | ICD-10-CM | POA: Diagnosis not present

## 2021-11-30 DIAGNOSIS — S0990XA Unspecified injury of head, initial encounter: Secondary | ICD-10-CM | POA: Diagnosis present

## 2021-11-30 DIAGNOSIS — M542 Cervicalgia: Secondary | ICD-10-CM

## 2021-11-30 MED ORDER — MELOXICAM 15 MG PO TABS: 15.0000 mg | ORAL_TABLET | Freq: Every day | ORAL | 0 refills | Status: DC

## 2021-11-30 MED ORDER — CYCLOBENZAPRINE HCL 10 MG PO TABS: 5.0000 mg | ORAL_TABLET | Freq: Two times a day (BID) | ORAL | 0 refills | Status: AC | PRN

## 2021-11-30 MED ORDER — CYCLOBENZAPRINE HCL 10 MG PO TABS: 5.0000 mg | ORAL_TABLET | Freq: Two times a day (BID) | ORAL | 0 refills | Status: DC | PRN

## 2021-11-30 NOTE — Discharge Instructions (Signed)
Your CTs were normal. ? ?You will be very sore over the next few days.  ?Take medication as needed. ? ?Return to the emergency department immediately if you develop any of the following symptoms: ?You have numbness, tingling, or weakness in the arms or legs. ?You develop severe headaches not relieved with medicine. ?You have severe neck pain, especially tenderness in the middle of the back of your neck. ?You have changes in bowel or bladder control. ?There is increasing pain in any area of the body. ?You have shortness of breath, light-headedness, dizziness, or fainting. ?You have chest pain. ?You feel sick to your stomach (nauseous), throw up (vomit), or sweat. ?You have increasing abdominal discomfort. ?There is blood in your urine, stool, or vomit. ?You have pain in your shoulder (shoulder strap areas). ?You feel your symptoms are getting worse. ? ?
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
## 2022-02-05 ENCOUNTER — Emergency Department (HOSPITAL_BASED_OUTPATIENT_CLINIC_OR_DEPARTMENT_OTHER)
Admission: EM | Admit: 2022-02-05 | Discharge: 2022-02-05 | Disposition: A | Discharge status: Home / Self Care | Payer: Self-pay | Attending: Emergency Medicine | Admitting: Emergency Medicine

## 2022-02-05 ENCOUNTER — Encounter (HOSPITAL_BASED_OUTPATIENT_CLINIC_OR_DEPARTMENT_OTHER): Payer: Self-pay

## 2022-02-05 ENCOUNTER — Emergency Department (HOSPITAL_BASED_OUTPATIENT_CLINIC_OR_DEPARTMENT_OTHER): Payer: Self-pay

## 2022-02-05 ENCOUNTER — Other Ambulatory Visit: Payer: Self-pay

## 2022-02-05 DIAGNOSIS — M7989 Other specified soft tissue disorders: Secondary | ICD-10-CM | POA: Insufficient documentation

## 2022-02-05 DIAGNOSIS — Z79899 Other long term (current) drug therapy: Secondary | ICD-10-CM | POA: Insufficient documentation

## 2022-02-05 DIAGNOSIS — M79601 Pain in right arm: Secondary | ICD-10-CM | POA: Insufficient documentation

## 2022-02-05 HISTORY — DX: Prediabetes: R73.03

## 2022-02-05 HISTORY — DX: Osteoarthritis of hip, unspecified: M16.9

## 2022-02-05 MED ORDER — OXYCODONE HCL 5 MG PO TABS
5.0000 mg | ORAL_TABLET | Freq: Once | ORAL | Status: AC
  Administered 2022-02-05: 5 mg via ORAL
  Filled 2022-02-05: qty 1

## 2022-02-05 MED ORDER — PROCHLORPERAZINE EDISYLATE 10 MG/2ML IJ SOLN
10.0000 mg | Freq: Once | INTRAMUSCULAR | Status: AC
  Administered 2022-02-05: 10 mg via INTRAMUSCULAR
  Filled 2022-02-05: qty 2

## 2022-02-05 MED ORDER — ACETAMINOPHEN 500 MG PO TABS
1000.0000 mg | ORAL_TABLET | Freq: Once | ORAL | Status: AC
Start: 2022-02-05 — End: 2022-02-05
  Administered 2022-02-05: 1000 mg via ORAL
  Filled 2022-02-05: qty 2

## 2022-02-05 NOTE — ED Provider Notes (Signed)
[facility] EMERGENCY DEPARTMENT Provider Note   CSN: [id number] Arrival date & time: 02/05/22  1217     History  Chief Complaint  Patient presents with   Arm Swelling    Thomasene MohairLatorsha Broadwell is a 46 y.o. female.  46 yo F with a chief complaints of right arm pain and swelling.  Going on for about a week now.  The patient had been seen [facility] and had a CT scan of the forearm.  She had seen her family doctor about 3 days ago and they had ordered an outpatient DVT study that has not yet occurred.  She feels like the pain goes all the way down the arm feels like it travels somewhat into the neck.  She denies trauma denies fever.  She walks with a cane and tells me that she has had gait instability for over a year and nobody is sure why.       Home Medications Prior to Admission medications   Medication Sig Start Date End Date Taking? Authorizing Provider  celecoxib (CELEBREX) 200 MG capsule Take 1 capsule (200 mg total) by mouth 2 (two) times daily. 09/15/19  Yes Harris, Abigail, PA-C  amitriptyline (ELAVIL) 25 MG tablet Take 4 tablets (100 mg total) by mouth daily for 30 days. 04/20/21     amitriptyline (ELAVIL) 25 MG tablet Take 4 tablets (100 mg total) by mouth daily for 30 days. 05/18/21     amLODipine (NORVASC) 5 MG tablet Take 1 tablet (5 mg total) by mouth daily. 04/20/21 05/20/21  Tanda RockersVenter, Margaux, PA-C  amoxicillin-clavulanate (AUGMENTIN) 875-125 MG tablet Take 1 tablet by mouth 2 times daily for 7 days. 06/21/21     butalbital-acetaminophen-caffeine (FIORICET WITH CODEINE) 50-325-40-30 MG capsule Take by mouth.    [provider]  chlorthalidone (HYGROTON) 25 MG tablet TAKE ONE-HALF TABLET BY MOUTH EVERY DAY 06/13/19   [provider]  cyclobenzaprine (FLEXERIL) 10 MG tablet Take 0.5-1 tablets (5-10 mg total) by mouth 2 (two) times daily as needed for muscle spasms. 11/30/21   Arthor CaptainHarris, Abigail, PA-C  hydrALAZINE (APRESOLINE) 25 MG tablet Take  25 mg by mouth. 01/28/16   [provider]  meloxicam (MOBIC) 15 MG tablet Take 1 tablet (15 mg total) by mouth daily. 11/30/21   Arthor CaptainHarris, Abigail, PA-C  methylPREDNISolone (MEDROL DOSEPAK) 4 MG TBPK tablet Use as directed 09/15/19   Arthor CaptainHarris, Abigail, PA-C  nortriptyline (PAMELOR) 25 MG capsule Take 1 capsule (25 mg total) by mouth at bedtime. 04/18/16   Hudnall, Azucena FallenShane R, MD  omeprazole (PRILOSEC) 20 MG capsule Take 20 mg by mouth. 01/14/16 01/13/17  [provider]  propranolol (INDERAL) 40 MG tablet Take 40 mg by mouth. 01/28/16   [provider]      Allergies    Lisinopril and Amlodipine    Review of Systems   Review of Systems  Physical Exam Updated Vital Signs BP (!) 164/101   Pulse 81   Temp 98.1 F (36.7 C) (Oral)   Resp 12   Ht 5\' 2"  (1.575 m)   Wt 75.8 kg   LMP 04/05/2016   SpO2 100%   BMI 30.54 kg/m  Physical Exam Vitals and nursing note reviewed.  Constitutional:      General: She is not in acute distress.    Appearance: She is well-developed. She is not diaphoretic.  HENT:     Head: Normocephalic and atraumatic.  Eyes:     Pupils: Pupils are equal, round, and reactive  to light.  Cardiovascular:     Rate and Rhythm: Normal rate and regular rhythm.     Heart sounds: No murmur heard.   No friction rub. No gallop.  Pulmonary:     Effort: Pulmonary effort is normal.     Breath sounds: No wheezing or rales.  Abdominal:     General: There is no distension.     Palpations: Abdomen is soft.     Tenderness: There is no abdominal tenderness.  Musculoskeletal:        General: No tenderness.     Cervical back: Normal range of motion and neck supple.     Comments: There is some increased fullness to the right arm compared to the left.  Pulse motor and sensation is intact.  No significant tenderness with palpation of the soft tissues or along the bony structures of the right arm.  Skin:    General: Skin is warm and dry.  Neurological:     Mental  Status: She is alert and oriented to person, place, and time.  Psychiatric:        Behavior: Behavior normal.    ED Results / Procedures / Treatments   Labs (all labs ordered are listed, but only abnormal results are displayed) Labs Reviewed - No data to display  EKG None  Radiology US Venous Img Upper Uni Right(DVT)  Result Date: 02/05/2022 CLINICAL DATA:  Right arm swelling and pain EXAM: RIGHT UPPER EXTREMITY VENOUS DOPPLER ULTRASOUND TECHNIQUE: Gray-scale sonography with graded compression, as well as color Doppler and duplex ultrasound were performed to evaluate the upper extremity deep venous system from the level of the subclavian vein and including the jugular, axillary, basilic, radial, ulnar and upper cephalic vein. Spectral Doppler was utilized to evaluate flow at rest and with distal augmentation maneuvers. COMPARISON:  None Available. FINDINGS: Contralateral Subclavian Vein: Respiratory phasicity is normal and symmetric with the symptomatic side. No evidence of thrombus. Normal compressibility. Internal Jugular Vein: No evidence of thrombus. Normal compressibility, respiratory phasicity and response to augmentation. Subclavian Vein: No evidence of thrombus. Normal compressibility, respiratory phasicity and response to augmentation. Axillary Vein: No evidence of thrombus. Normal compressibility, respiratory phasicity and response to augmentation. Cephalic Vein: No evidence of thrombus. Normal compressibility, respiratory phasicity and response to augmentation. Basilic Vein: No evidence of thrombus. Normal compressibility, respiratory phasicity and response to augmentation. Brachial Veins: No evidence of thrombus. Normal compressibility, respiratory phasicity and response to augmentation. Radial Veins: No evidence of thrombus. Normal compressibility, respiratory phasicity and response to augmentation. Ulnar Veins: No evidence of thrombus. Normal compressibility, respiratory phasicity and  response to augmentation. Venous Reflux:  None visualized. Other Findings: Enlarged lymph node overlying the right internal jugular vein measuring 2.0 x 0.6 cm. IMPRESSION: 1. Negative examination for deep venous thrombosis in the right upper extremity. 2. Enlarged cervical lymph node overlying the right internal jugular vein, nonspecific. Electronically Signed   By: Jearld Lesch M.D.   On: 02/05/2022 14:04    Procedures Procedures    Medications Ordered in ED Medications  oxyCODONE (Oxy IR/ROXICODONE) immediate release tablet 5 mg (5 mg Oral Given 02/05/22 1339)  acetaminophen (TYLENOL) tablet 1,000 mg (1,000 mg Oral Given 02/05/22 1339)    ED Course/ Medical Decision Making/ A&P                           Medical Decision Making Risk OTC drugs. Prescription drug management.   46 yo F with a chief  complaints of arm pain.  This has been going on for about a week.  Started suddenly while she was watching TV according to out side hospital system notes.  She went to the emergency department at [facility] and had a CT scan of the forearm that was negative for necrotizing fasciitis she had a EKG chest x-ray that also were unremarkable.  She followed up with her family doctor about 48 hours ago and he had lab work obtained.  On my record review I do not see any concerning finding, inflammatory markers were ordered with normal sed rate and CRP.  No significant anemia.  We will obtain a DVT study of the right upper extremity here.  Of note the patient also has been using a cane for over a year due to gait instability.  On further record review the patient has a diagnosis of fibromyalgia is seen rheumatology and has seen pain management.  Patient's DVT study is negative.  I discussed results with the patient.  They are requesting something for pain at home.  Patient unfortunately sees pain management and I do not feel comfortable prescribing her acute pain medications when she has multiple  chronic pain issues.  We will have her follow-up with her pain management physician.  2:48 PM:  I have discussed the diagnosis/risks/treatment options with the patient and family.  Evaluation and diagnostic testing in the emergency department does not suggest an emergent condition requiring admission or immediate intervention beyond what has been performed at this time.  They will follow up with  PCP. We also discussed returning to the ED immediately if new or worsening sx occur. We discussed the sx which are most concerning (e.g., sudden worsening pain, fever, inability to tolerate by mouth) that necessitate immediate return. Medications administered to the patient during their visit and any new prescriptions provided to the patient are listed below.  Medications given during this visit Medications  oxyCODONE (Oxy IR/ROXICODONE) immediate release tablet 5 mg (5 mg Oral Given 02/05/22 1339)  acetaminophen (TYLENOL) tablet 1,000 mg (1,000 mg Oral Given 02/05/22 1339)     The patient appears reasonably screen and/or stabilized for discharge and I doubt any other medical condition or other [facility] requiring further screening, evaluation, or treatment in the ED at this time prior to discharge.          Final Clinical Impression(s) / ED Diagnoses Final diagnoses:  Right arm pain    Rx / DC Orders ED Discharge Orders     None         Melene Plan, DO 02/05/22 1448

## 2022-02-05 NOTE — ED Notes (Signed)
US at bedside

## 2022-02-05 NOTE — ED Triage Notes (Signed)
Pt reports right arm and hand swelling x 1 week. Seen in ED at [place] and images taken. Unsure what is causing. Swelling has moved from arm to hand. Seen by PCP 2 days ago

## 2022-02-05 NOTE — Discharge Instructions (Signed)
Follow up with your doctor.  Return for worsening pain, fever.

## 2022-09-18 ENCOUNTER — Emergency Department (HOSPITAL_BASED_OUTPATIENT_CLINIC_OR_DEPARTMENT_OTHER): Payer: Commercial Managed Care - HMO

## 2022-09-18 ENCOUNTER — Encounter (HOSPITAL_BASED_OUTPATIENT_CLINIC_OR_DEPARTMENT_OTHER): Payer: Self-pay | Admitting: Emergency Medicine

## 2022-09-18 ENCOUNTER — Other Ambulatory Visit: Payer: Self-pay

## 2022-09-18 ENCOUNTER — Emergency Department (HOSPITAL_BASED_OUTPATIENT_CLINIC_OR_DEPARTMENT_OTHER)
Admission: EM | Admit: 2022-09-18 | Discharge: 2022-09-18 | Disposition: A | Discharge status: Home / Self Care | Payer: Commercial Managed Care - HMO | Attending: Emergency Medicine | Admitting: Emergency Medicine

## 2022-09-18 DIAGNOSIS — Z23 Encounter for immunization: Secondary | ICD-10-CM | POA: Diagnosis not present

## 2022-09-18 DIAGNOSIS — S61412A Laceration without foreign body of left hand, initial encounter: Secondary | ICD-10-CM

## 2022-09-18 DIAGNOSIS — W268XXA Contact with other sharp object(s), not elsewhere classified, initial encounter: Secondary | ICD-10-CM | POA: Insufficient documentation

## 2022-09-18 MED ORDER — TETANUS-DIPHTH-ACELL PERTUSSIS 5-2.5-18.5 LF-MCG/0.5 IM SUSY
0.5000 mL | PREFILLED_SYRINGE | Freq: Once | INTRAMUSCULAR | Status: AC
Start: 2022-09-18 — End: 2022-09-18
  Administered 2022-09-18: 0.5 mL via INTRAMUSCULAR
  Filled 2022-09-18: qty 0.5

## 2022-09-18 MED ORDER — LIDOCAINE HCL 2 % IJ SOLN
20.0000 mL | Freq: Once | INTRAMUSCULAR | Status: AC
Start: 2022-09-18 — End: 2022-09-18
  Administered 2022-09-18: 400 mg
  Filled 2022-09-18: qty 20

## 2022-09-18 MED ORDER — MELOXICAM 7.5 MG PO TABS: 7.5000 mg | ORAL_TABLET | Freq: Every day | ORAL | 0 refills | Status: AC

## 2022-09-18 NOTE — Discharge Instructions (Signed)
You have been seen today for your complaint of 2 lacerations to the left hand. Your imaging was reassuring and showed no abnormalities. Your discharge medications include Alternate tylenol and ibuprofen for pain. You may alternate these every 4 hours. You may take up to 800 mg of ibuprofen at a time and up to 1000 mg of tylenol. Home care instructions are as follows:  Keep the wound clean and dry for 24 hours.  You may clean with warm soapy water once daily after that.  Do not submerge the wound until you get the stitches removed. Follow up with: Your primary care provider or any urgent care or any emergency room in 7 to 10 days for suture removal Please seek immediate medical care if you develop any of the following symptoms: You develop severe swelling around the wound. Your pain suddenly increases and is severe. You develop painful lumps near the wound or on skin anywhere else on your body. You have a red streak going away from your wound. The wound is on your hand or foot, and you cannot properly move a finger or toe. The wound is on your hand or foot, and you notice that your fingers or toes look pale or bluish. At this time there does not appear to be the presence of an emergent medical condition, however there is always the potential for conditions to change. Please read and follow the below instructions.  Do not take your medicine if  develop an itchy rash, swelling in your mouth or lips, or difficulty breathing; call 911 and seek immediate emergency medical attention if this occurs.  You may review your lab tests and imaging results in their entirety on your MyChart account.  Please discuss all results of fully with your primary care provider and other specialist at your follow-up visit.  Note: Portions of this text may have been transcribed using voice recognition software. Every effort was made to ensure accuracy; however, inadvertent computerized transcription errors may still be  present.

## 2022-09-18 NOTE — ED Triage Notes (Signed)
Pt arrives pov, steady gait, endorses LT palmar and LT index laceration on can lid. Bleeding controlled, bandage applied in triage. Unknown last tetanus

## 2022-09-18 NOTE — ED Provider Notes (Signed)
[facility] Provider Note   CSN: [id number] Arrival date & time: 09/18/22  1645     History  Chief Complaint  Patient presents with   Extremity Laceration    Melissa Clay is a 47 y.o. female.  Who presents ED for evaluation of laceration to the palmar aspect of the left hand and the left pointer finger.  She states she was making dinner just prior to arrival when she opened a can and cut her hand on the can.  Does not know when her last tetanus vaccine was.  Denies numbness or tingling of the affected hand.  Denies difficulty with range of motion.  Bleeding was controlled with direct pressure prior to arrival.   HPI     Home Medications Prior to Admission medications   Medication Sig Start Date End Date Taking? Authorizing Provider  amitriptyline (ELAVIL) 25 MG tablet Take 4 tablets (100 mg total) by mouth daily for 30 days. 04/20/21     amitriptyline (ELAVIL) 25 MG tablet Take 4 tablets (100 mg total) by mouth daily for 30 days. 05/18/21     amLODipine (NORVASC) 5 MG tablet Take 1 tablet (5 mg total) by mouth daily. 04/20/21 05/20/21  Eustaquio Maize, PA-C  amoxicillin-clavulanate (AUGMENTIN) 875-125 MG tablet Take 1 tablet by mouth 2 times daily for 7 days. 06/21/21     butalbital-acetaminophen-caffeine (FIORICET WITH CODEINE) 50-325-40-30 MG capsule Take by mouth.    [provider]  celecoxib (CELEBREX) 200 MG capsule Take 1 capsule (200 mg total) by mouth 2 (two) times daily. 09/15/19   Harris, Vernie Shanks, PA-C  chlorthalidone (HYGROTON) 25 MG tablet TAKE ONE-HALF TABLET BY MOUTH EVERY DAY 06/13/19   [provider]  cyclobenzaprine (FLEXERIL) 10 MG tablet Take 0.5-1 tablets (5-10 mg total) by mouth 2 (two) times daily as needed for muscle spasms. 11/30/21   Margarita Mail, PA-C  hydrALAZINE (APRESOLINE) 25 MG tablet Take 25 mg by mouth. 01/28/16   [provider]  meloxicam (MOBIC) 15 MG tablet Take 1 tablet (15 mg total) by mouth  daily. 11/30/21   Margarita Mail, PA-C  methylPREDNISolone (MEDROL DOSEPAK) 4 MG TBPK tablet Use as directed 09/15/19   Margarita Mail, PA-C  nortriptyline (PAMELOR) 25 MG capsule Take 1 capsule (25 mg total) by mouth at bedtime. 04/18/16   Hudnall, Sharyn Lull, MD  omeprazole (PRILOSEC) 20 MG capsule Take 20 mg by mouth. 01/14/16 01/13/17  [provider]  propranolol (INDERAL) 40 MG tablet Take 40 mg by mouth. 01/28/16   [provider]      Allergies    Lisinopril and Amlodipine    Review of Systems   Review of Systems  Skin:  Positive for wound.  All other systems reviewed and are negative.   Physical Exam Updated Vital Signs BP (!) 146/108 (BP Location: Right Arm)   Pulse (!) 110   Temp 97.9 F (36.6 C) (Oral)   Resp 18   Wt 77.1 kg   LMP 04/05/2016   SpO2 100%   BMI 31.09 kg/m  Physical Exam Vitals and nursing note reviewed.  Constitutional:      General: She is not in acute distress.    Appearance: Normal appearance. She is well-developed and normal weight. She is not ill-appearing.  HENT:     Head: Normocephalic and atraumatic.  Eyes:     Conjunctiva/sclera: Conjunctivae normal.  Cardiovascular:     Rate and Rhythm: Normal rate and regular rhythm.     Heart sounds: No  murmur heard. Pulmonary:     Effort: Pulmonary effort is normal. No respiratory distress.     Breath sounds: Normal breath sounds.  Abdominal:     General: Abdomen is flat.     Palpations: Abdomen is soft.     Tenderness: There is no abdominal tenderness.  Musculoskeletal:        General: No swelling. Normal range of motion.     Cervical back: Neck supple.     Comments: 2 lacerations to the palmar aspect of the left hand.  Normal range of motion.  Sensation intact.  Capillary refill normal.  No active bleeding.  Skin:    General: Skin is warm and dry.     Capillary Refill: Capillary refill takes less than 2 seconds.     Findings: Lesion present.     Comments: 2 cm laceration to palmar  aspect of the left hand on the thenar eminence.  3 cm longitudinal laceration on the pad of the left pointer finger  Neurological:     Mental Status: She is alert and oriented to person, place, and time.  Psychiatric:        Mood and Affect: Mood normal.        Behavior: Behavior normal.     ED Results / Procedures / Treatments   Labs (all labs ordered are listed, but only abnormal results are displayed) Labs Reviewed - No data to display  EKG None  Radiology DG Hand Complete Left  Result Date: 09/18/2022 CLINICAL DATA:  Trauma EXAM: LEFT HAND - COMPLETE 3+ VIEW COMPARISON:  Left thumb done on 01/10/2020 FINDINGS: No fracture or dislocation is seen. There are no opaque foreign bodies. Gauze dressing is noted in the index finger. IMPRESSION: No fracture or dislocation is seen. There are no opaque foreign bodies. Electronically Signed   By: Ernie Avena M.D.   On: 09/18/2022 18:11    Procedures .Marland KitchenLaceration Repair  Date/Time: 09/18/2022 9:35 PM  Performed by: Michelle Piper, PA-C Authorized by: Michelle Piper, PA-C   Consent:    Consent obtained:  Verbal   Consent given by:  Patient   Risks, benefits, and alternatives were discussed: yes     Risks discussed:  Poor wound healing and pain   Alternatives discussed:  No treatment Universal protocol:    Imaging studies available: yes     Site/side marked: yes     Immediately prior to procedure, a time out was called: yes     Patient identity confirmed:  Verbally with patient Anesthesia:    Anesthesia method:  Local infiltration and nerve block   Local anesthetic:  Lidocaine 2% w/o epi (L palmar thenar eminence)   Block location:  L pointer finger   Block needle gauge:  25 G   Block anesthetic:  Lidocaine 2% w/o epi   Block technique:  Digital   Block injection procedure:  Anatomic landmarks identified, introduced needle, incremental injection, negative aspiration for blood and anatomic landmarks palpated   Block  outcome:  Anesthesia achieved Laceration details:    Location:  Hand   Hand location:  L palm   Length (cm):  3 Pre-procedure details:    Preparation:  Patient was prepped and draped in usual sterile fashion and imaging obtained to evaluate for foreign bodies Exploration:    Hemostasis achieved with:  Tourniquet and direct pressure   Imaging obtained: x-ray     Imaging outcome: foreign body not noted     Wound exploration: wound explored through full  range of motion and entire depth of wound visualized   Treatment:    Area cleansed with:  Povidone-iodine and soap and water   Amount of cleaning:  Standard   Irrigation solution:  Tap water   Irrigation method:  Tap Skin repair:    Repair method:  Sutures   Suture size:  5-0   Suture material:  Nylon   Suture technique:  Simple interrupted   Number of sutures:  5 Approximation:    Approximation:  Close Repair type:    Repair type:  Simple Post-procedure details:    Dressing:  Non-adherent dressing   Procedure completion:  Tolerated well, no immediate complications     Medications Ordered in ED Medications  Tdap (BOOSTRIX) injection 0.5 mL (has no administration in time range)  lidocaine (XYLOCAINE) 2 % (with pres) injection 400 mg (400 mg Infiltration Given 09/18/22 2035)    ED Course/ Medical Decision Making/ A&P                           Medical Decision Making Amount and/or Complexity of Data Reviewed Radiology: ordered.  Risk Prescription drug management.  This patient presents to the ED for concern of 2 lacerations to the left hand, this involves an extensive number of treatment options,   My initial workup includes a manage left hand, laceration repair, update tetanus  Additional history obtained from: Nursing notes from this visit.  I ordered imaging studies including x-ray left hand I independently visualized and interpreted imaging which showed no fractures or foreign bodies I agree with the radiologist  interpretation  Afebrile, hemodynamically stable.  47 year old female presents ED for evaluation of 2 lacerations to the palmar aspect of her left hand as stated above.  Does not know when her last tetanus vaccine was.  X-ray negative for fracture or foreign body.  Neurovascular status intact.  Laceration was repaired as stated above and without incident.  Patient was educated on wound care and suture removal timing.  Her tetanus was updated in the ED.  She was given strict return precautions.  Stable at discharge.  At this time there does not appear to be any evidence of an acute emergency medical condition and the patient appears stable for discharge with appropriate outpatient follow up. Diagnosis was discussed with patient who verbalizes understanding of care plan and is agreeable to discharge. I have discussed return precautions with patient who verbalizes understanding. Patient encouraged to follow-up with their PCP within 1 week. All questions answered.  Patient's case discussed with Dr. Renaye Rakers who agrees with plan to discharge with follow-up.   Note: Portions of this report may have been transcribed using voice recognition software. Every effort was made to ensure accuracy; however, inadvertent computerized transcription errors may still be present.         Final Clinical Impression(s) / ED Diagnoses Final diagnoses:  Laceration of left hand without foreign body, initial encounter    Rx / DC Orders ED Discharge Orders     None         Mora Bellman 09/18/22 2138    Terald Sleeper, MD 09/18/22 [phone]

## 2022-09-18 NOTE — ED Notes (Signed)
Pt wound care provided. Cleaned up dried blood. Placed bacitracin on sutures. Wrapped with kerlex and provided pt with extra supplies and educated on how to clean and dry wound at home.

## 2023-07-18 IMAGING — CT CT HEAD W/O CM
3 series · 15 of 47 positions shown, 18 images · non-contrast
Comparison: None.

CLINICAL DATA: Headaches, elevated blood pressure

EXAM:
CT HEAD WITHOUT CONTRAST
TECHNIQUE: Contiguous axial images were obtained from the base of the skull
through the vertex without intravenous contrast.

[Series 2: head wo · axial · 0.46mm/px · z∈[+1126,+1256]mm · 9 of 32 slices shown, 12 images]
[im 3/32  brain]
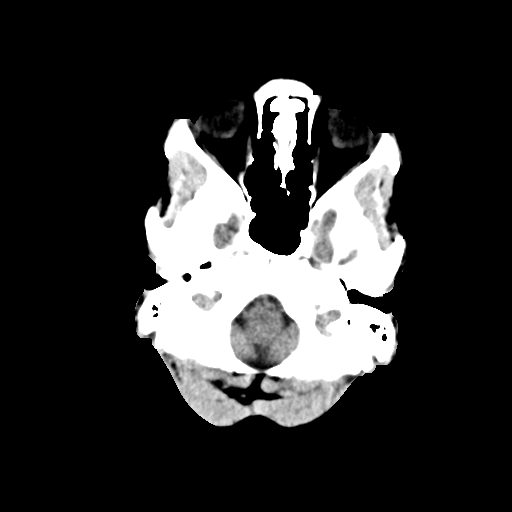
[im 3/32  bone]
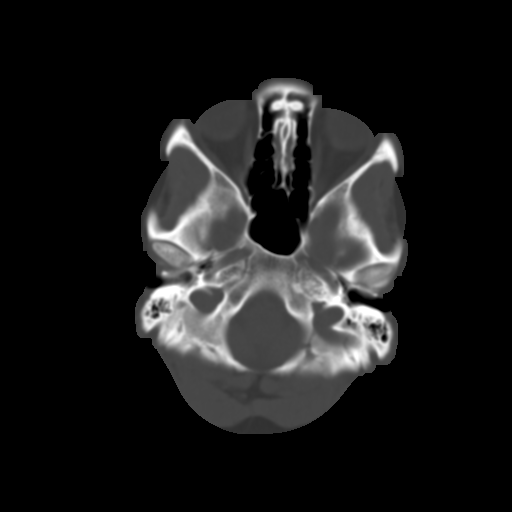
[im 6/32  brain]
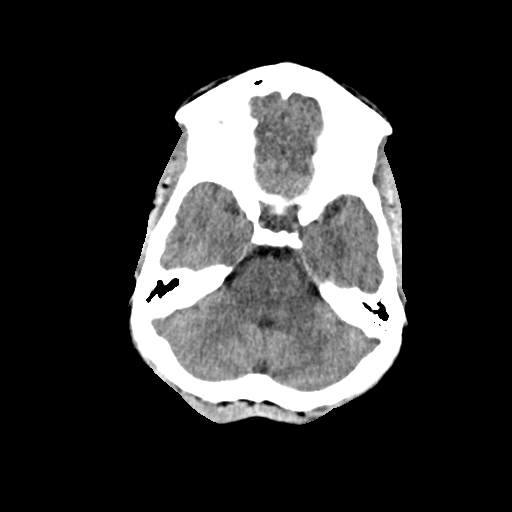
[im 9/32  brain]
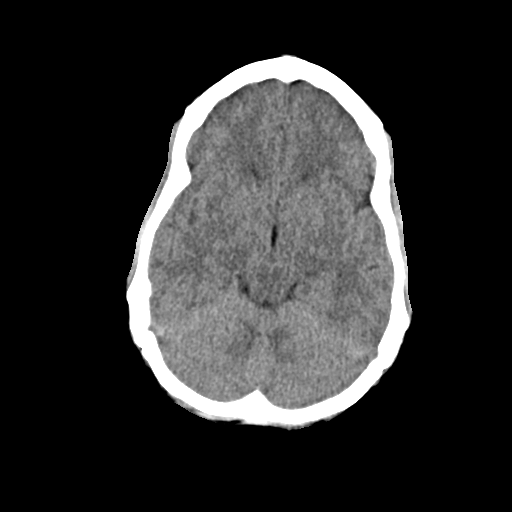
[im 12/32  brain]
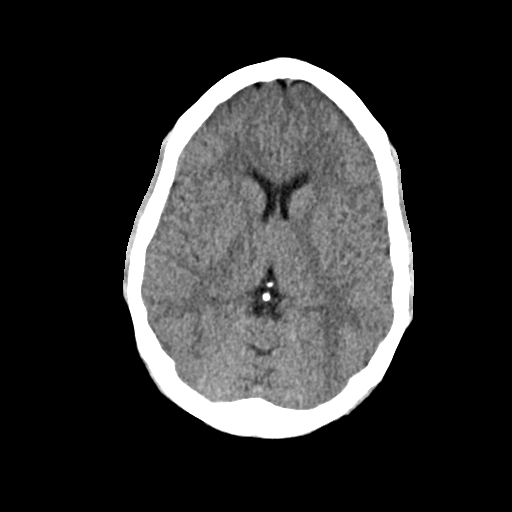
[im 17/32  brain]
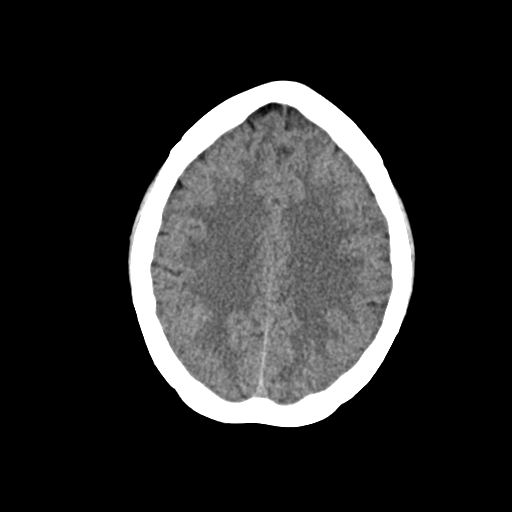
[im 17/32  bone]
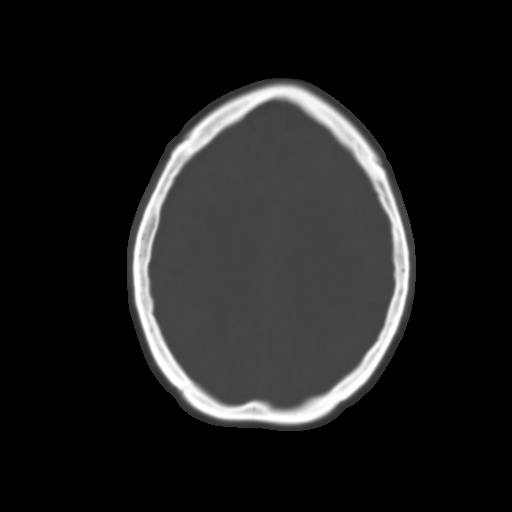
[im 20/32  brain]
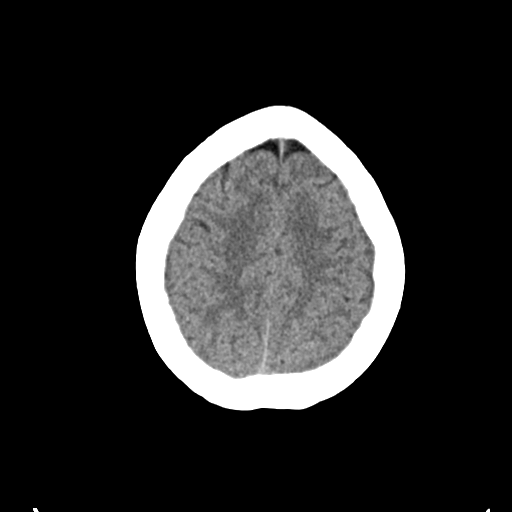
[im 23/32  brain]
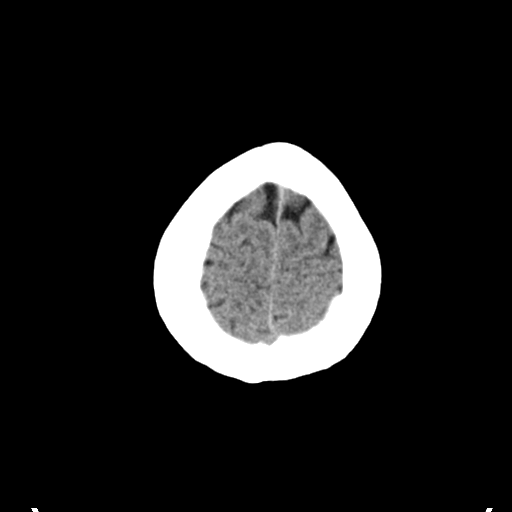
[im 26/32  brain]
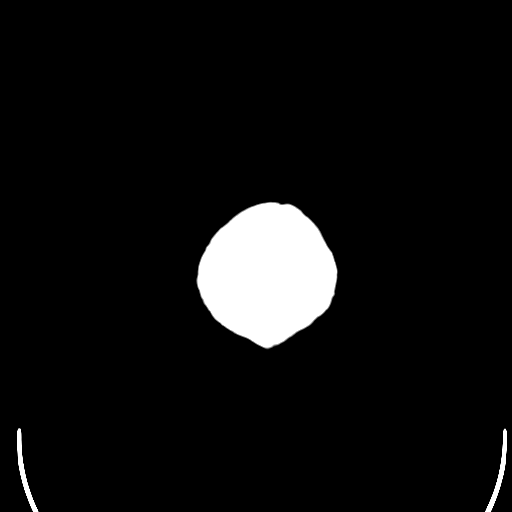
[im 29/32  brain]
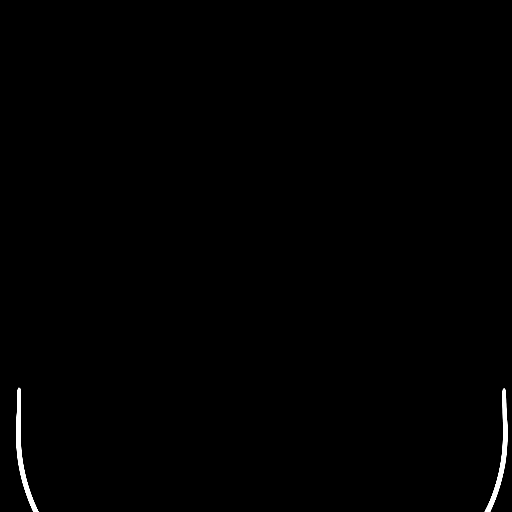
[im 29/32  bone]
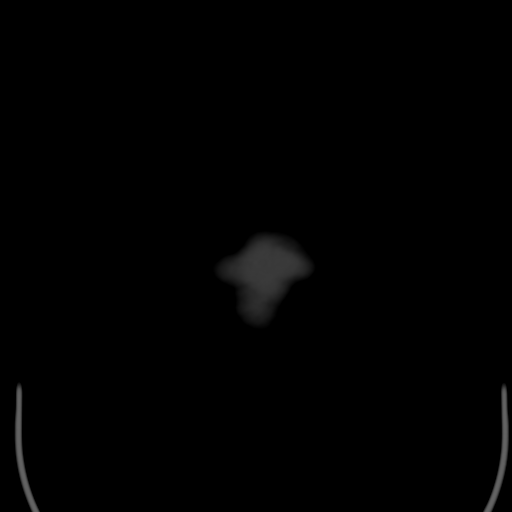

[Series 4: coronal soft · coronal · 0.30mm/px · 3 of 67 slices shown]
[im 23/67  brain]
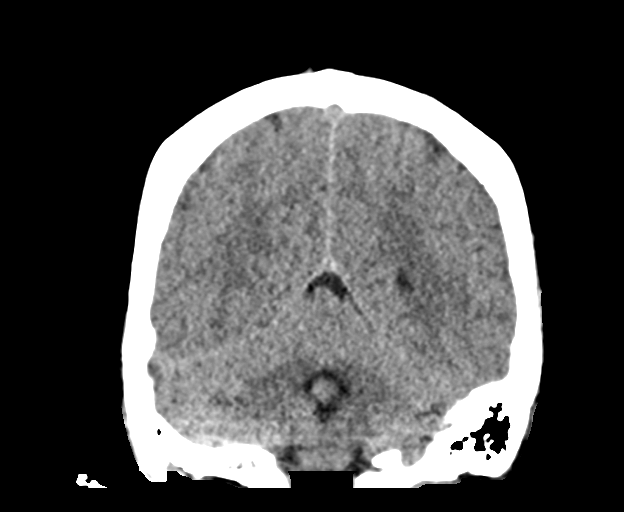
[im 30/67  brain]
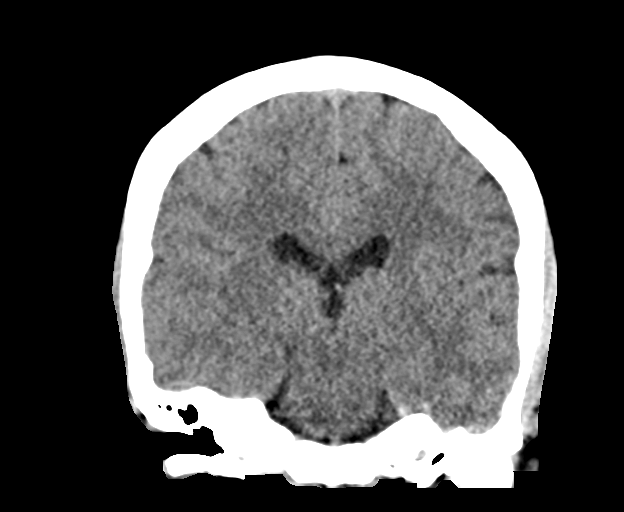
[im 37/67  brain]
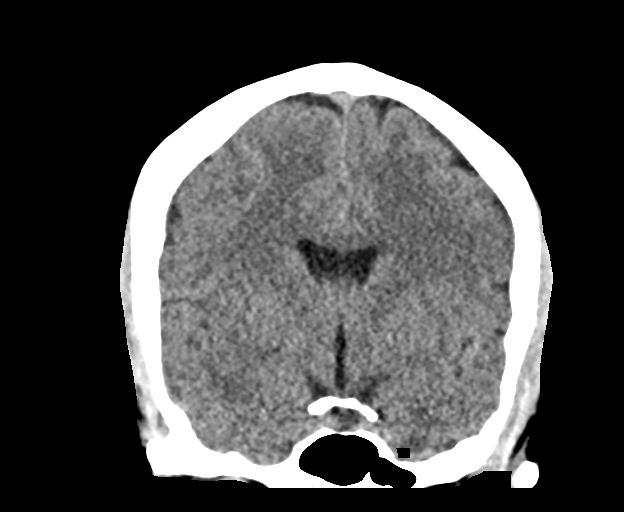

[Series 5: sag soft · sagittal · 0.30mm/px · 3 of 67 slices shown]
[im 23/67  brain]
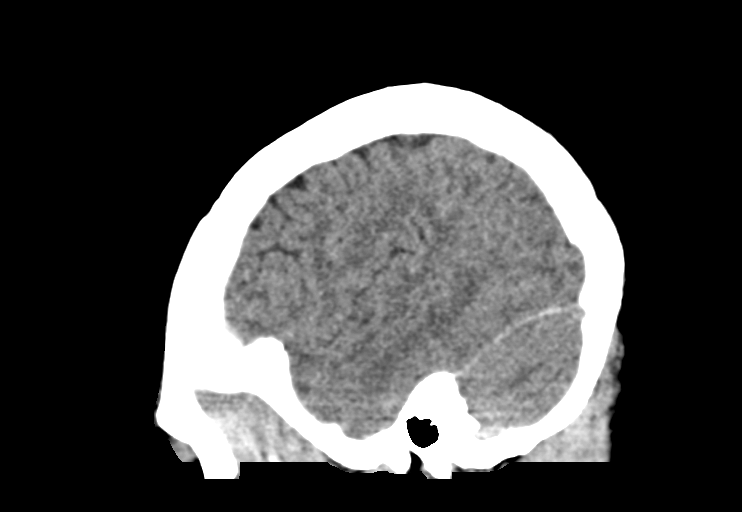
[im 34/67  brain]
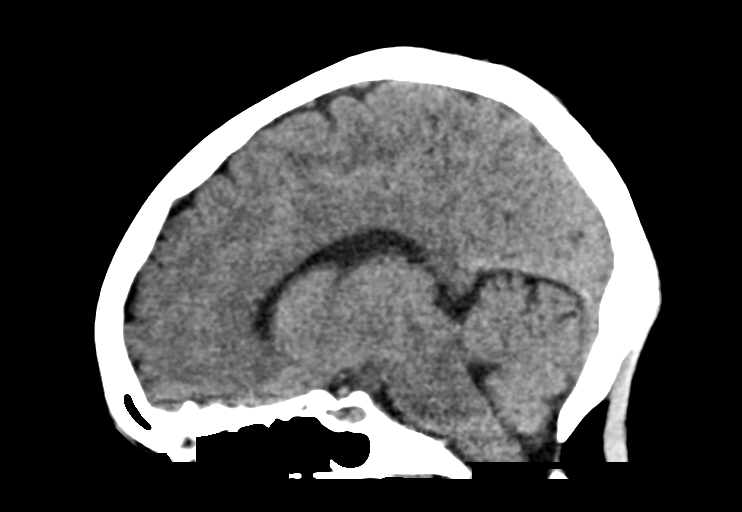
[im 45/67  brain]
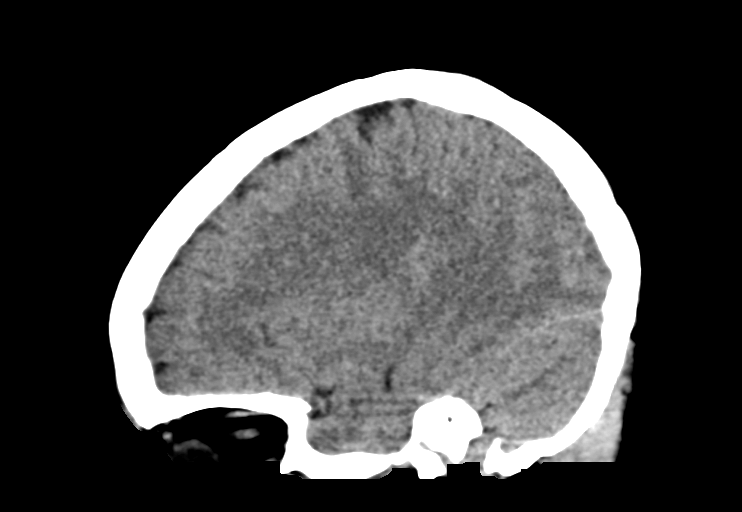

[15 of 47 positions shown; findings below may reference images not displayed]

FINDINGS: Brain: No acute intracranial hemorrhage. No focal mass lesion. No CT
evidence of acute infarction. No midline shift or mass effect. No
hydrocephalus. Basilar cisterns are patent.

Vascular: No hyperdense vessel or unexpected calcification.

Skull: Normal. Negative for fracture or focal lesion.

Sinuses/Orbits: Paranasal sinuses and mastoid air cells are clear.
Orbits are clear.

Other: None.
IMPRESSION: No acute intracranial findings.

## 2023-10-01 ENCOUNTER — Other Ambulatory Visit: Payer: Self-pay

## 2023-10-01 ENCOUNTER — Emergency Department (HOSPITAL_BASED_OUTPATIENT_CLINIC_OR_DEPARTMENT_OTHER)
Admission: EM | Admit: 2023-10-01 | Discharge: 2023-10-01 | Disposition: A | Discharge status: Home / Self Care | Payer: Medicaid Other | Attending: Emergency Medicine | Admitting: Emergency Medicine

## 2023-10-01 ENCOUNTER — Encounter (HOSPITAL_BASED_OUTPATIENT_CLINIC_OR_DEPARTMENT_OTHER): Payer: Self-pay | Admitting: *Deleted

## 2023-10-01 DIAGNOSIS — M546 Pain in thoracic spine: Secondary | ICD-10-CM | POA: Diagnosis present

## 2023-10-01 DIAGNOSIS — M549 Dorsalgia, unspecified: Secondary | ICD-10-CM

## 2023-10-01 MED ORDER — DIAZEPAM 5 MG PO TABS
5.0000 mg | ORAL_TABLET | Freq: Once | ORAL | Status: AC
  Administered 2023-10-01: 5 mg via ORAL
  Filled 2023-10-01: qty 1

## 2023-10-01 MED ORDER — HYDROMORPHONE HCL 1 MG/ML IJ SOLN
2.0000 mg | Freq: Once | INTRAMUSCULAR | Status: AC
  Administered 2023-10-01: 2 mg via INTRAMUSCULAR
  Filled 2023-10-01: qty 2

## 2023-10-01 NOTE — Discharge Instructions (Addendum)
Please reach out to your spine team to see if they can adjust your pain regiment or offer any other interventions.  Return if any worsening or concerning symptoms

## 2023-10-01 NOTE — ED Triage Notes (Signed)
Pt adds that she feels she has swelling in her legs and feet

## 2023-10-01 NOTE — ED Provider Notes (Signed)
[facility] Provider Note   CSN: [id number] Arrival date & time: 10/01/23  1651     History {Add pertinent medical, surgical, social history, OB history to HPI:1} Chief Complaint  Patient presents with   Back Pain    Melissa Clay is a 48 y.o. female.  She has chronic back pain radiating into both of her legs and is under the care of of the spine team and the pain clinic.  She said she has had injections and recently had her pain medicines adjusted.  Complaining of worsening low back pain into both legs since last night.  She has noticed some on and off swelling in her lower legs and feet.  No bowel or bladder incontinence.  She said she had a fall a few weeks ago but did not seem to have any lasting problems from that.  No fevers or chills.  Her regular medications are not helping.  The history is provided by the patient.  Back Pain Location:  Lumbar spine Quality:  Aching Radiates to:  R foot and L foot Pain severity:  Severe Pain is:  Same all the time Onset quality:  Gradual Duration:  1 day Timing:  Constant Progression:  Unchanged Chronicity:  Chronic Relieved by:  Nothing Ineffective treatments:  Narcotics, muscle relaxants and NSAIDs Associated symptoms: leg pain   Associated symptoms: no abdominal pain, no bladder incontinence, no bowel incontinence, no chest pain, no dysuria, no fever and no weakness        Home Medications Prior to Admission medications   Medication Sig Start Date End Date Taking? Authorizing Provider  amitriptyline (ELAVIL) 25 MG tablet Take 4 tablets (100 mg total) by mouth daily for 30 days. 04/20/21     amitriptyline (ELAVIL) 25 MG tablet Take 4 tablets (100 mg total) by mouth daily for 30 days. 05/18/21     amLODipine (NORVASC) 5 MG tablet Take 1 tablet (5 mg total) by mouth daily. 04/20/21 05/20/21  Tanda Rockers, PA-C  amoxicillin-clavulanate (AUGMENTIN) 875-125 MG tablet Take 1 tablet by mouth 2  times daily for 7 days. 06/21/21     butalbital-acetaminophen-caffeine (FIORICET WITH CODEINE) 50-325-40-30 MG capsule Take by mouth.    [provider]  celecoxib (CELEBREX) 200 MG capsule Take 1 capsule (200 mg total) by mouth 2 (two) times daily. 09/15/19   Harris, Cammy Copa, PA-C  chlorthalidone (HYGROTON) 25 MG tablet TAKE ONE-HALF TABLET BY MOUTH EVERY DAY 06/13/19   [provider]  cyclobenzaprine (FLEXERIL) 10 MG tablet Take 0.5-1 tablets (5-10 mg total) by mouth 2 (two) times daily as needed for muscle spasms. 11/30/21   Arthor Captain, PA-C  hydrALAZINE (APRESOLINE) 25 MG tablet Take 25 mg by mouth. 01/28/16   [provider]  meloxicam (MOBIC) 7.5 MG tablet Take 1 tablet (7.5 mg total) by mouth daily. 09/18/22   Schutt, Edsel Petrin, PA-C  methylPREDNISolone (MEDROL DOSEPAK) 4 MG TBPK tablet Use as directed 09/15/19   Arthor Captain, PA-C  nortriptyline (PAMELOR) 25 MG capsule Take 1 capsule (25 mg total) by mouth at bedtime. 04/18/16   Hudnall, Azucena Fallen, MD  omeprazole (PRILOSEC) 20 MG capsule Take 20 mg by mouth. 01/14/16 01/13/17  [provider]  propranolol (INDERAL) 40 MG tablet Take 40 mg by mouth. 01/28/16   [provider]      Allergies    Lisinopril and Amlodipine    Review of Systems   Review of Systems  Constitutional:  Negative for fever.  Respiratory:  Negative for shortness of breath.   Cardiovascular:  Negative for chest pain.  Gastrointestinal:  Negative for abdominal pain and bowel incontinence.  Genitourinary:  Negative for bladder incontinence, dysuria and hematuria.  Musculoskeletal:  Positive for back pain.  Neurological:  Negative for weakness.    Physical Exam Updated Vital Signs BP (!) 132/101   Pulse (!) 102   Temp 98.7 F (37.1 C) (Oral)   Resp 16   Ht 5\' 2"  (1.575 m)   Wt 79.4 kg   LMP 04/05/2016   SpO2 100%   BMI 32.01 kg/m  Physical Exam Vitals and nursing note reviewed.  Constitutional:      General: She  is not in acute distress.    Appearance: She is well-developed.  HENT:     Head: Normocephalic and atraumatic.  Eyes:     Conjunctiva/sclera: Conjunctivae normal.  Cardiovascular:     Rate and Rhythm: Normal rate and regular rhythm.     Heart sounds: No murmur heard. Pulmonary:     Effort: Pulmonary effort is normal. No respiratory distress.     Breath sounds: Normal breath sounds.  Abdominal:     Palpations: Abdomen is soft.     Tenderness: There is no abdominal tenderness. There is no guarding or rebound.  Musculoskeletal:        General: Tenderness present.     Cervical back: Neck supple.     Comments: she has diffuse lower back tenderness.  No step-offs.  Skin:    General: Skin is warm and dry.     Capillary Refill: Capillary refill takes less than 2 seconds.  Neurological:     General: No focal deficit present.     Mental Status: She is alert.     Motor: No weakness.     Gait: Gait normal.     ED Results / Procedures / Treatments   Labs (all labs ordered are listed, but only abnormal results are displayed) Labs Reviewed - No data to display  EKG None  Radiology No results found.  Procedures Procedures  {Document cardiac monitor, telemetry assessment procedure when appropriate:1}  Medications Ordered in ED Medications  HYDROmorphone (DILAUDID) injection 2 mg (has no administration in time range)  diazepam (VALIUM) tablet 5 mg (has no administration in time range)    ED Course/ Medical Decision Making/ A&P   {   Click here for ABCD2, HEART and other calculatorsREFRESH Note before signing :1}                              Medical Decision Making Risk Prescription drug management.   This patient complains of ***; this involves an extensive number of treatment Options and is a complaint that carries with it a high risk of complications and morbidity. The differential includes ***  I ordered, reviewed and interpreted labs, which included *** I ordered  medication *** and reviewed PMP when indicated. I ordered imaging studies which included *** and I independently    visualized and interpreted imaging which showed *** Additional history obtained from *** Previous records obtained and reviewed *** I consulted *** and discussed lab and imaging findings and discussed disposition.  Cardiac monitoring reviewed, *** Social determinants considered, *** Critical Interventions: ***  After the interventions stated above, I reevaluated the patient and found *** Admission and further testing considered, ***   {Document critical care time when appropriate:1} {Document review of labs and clinical decision tools  ie heart score, Chads2Vasc2 etc:1}  {Document your independent review of radiology images, and any outside records:1} {Document your discussion with family members, caretakers, and with consultants:1} {Document social determinants of health affecting pt's care:1} {Document your decision making why or why not admission, treatments were needed:1} Final Clinical Impression(s) / ED Diagnoses Final diagnoses:  None    Rx / DC Orders ED Discharge Orders     None

## 2023-10-01 NOTE — ED Triage Notes (Signed)
Pt with hx of DDD and  chronic back pain, osteoarthritis of hips and fibromyalgia is here for worsening lower back pain with pain radiating into bilateral legs.  Pain increased over past day, not associated with trauma in this time frame. Pt notes that she did fall a few weeks ago

## 2024-01-30 NOTE — Progress Notes (Signed)
 Payor: [place] [employer]ICA[employer] / Plan: [place] MEDICA[employer] / Product Type: Managed Medicaid /   Family Medicine - [facility]  SUBJECTIVE  Chief Complaint:  Chief Complaint  Patient presents with  . Follow-up   History of Present Illness: Melissa Clay is a 48 y.o. female with medical problems listed below who presents for:  History of Present Illness The patient presents for evaluation of migraines, fibromyalgia, and disability paperwork.  Migraines - Prescribed Topamax in April 2025 for migraine management - Continues to experience approximately 4 migraines per month with no noticeable improvement - Experiencing daily tension headaches characterized by generalized pain extending into her neck, possibly related to fibromyalgia - Has not undergone physical therapy for migraines - Experiences at least 2 migraines per month and does not believe her condition has improved - Not currently using a Butrans patch or any opioids - Previously visited a headache clinic but not recently  Fibromyalgia - Under the care of a pain clinic with a scheduled appointment in August 2025 - Overall pain has been escalating, occasionally requiring a wheelchair or walker for mobility - Pain severe enough to disrupt sleep and necessitates assistance from her daughters for wheelchair use - Reports poor sleep quality and fatigue - Last saw her pain doctor in November 2024  Sciatic Nerve Pain - Reports swelling and severe hip pain attributed to her sciatic nerve - Unable to lift more than 10 pounds due to pain - Cannot sit or stand for more than 5 minutes at a time    Problem List: Patient Active Problem List  Diagnosis  . Dysmenorrhea  . Menorrhagia with irregular cycle  . Drug-induced constipation  . Pelvic adhesions  . Chronic bilateral low back pain without sciatica  . Gastroesophageal reflux disease with esophagitis  .  Chronic gastric ulcer without hemorrhage and without perforation  . Lumbar facet arthropathy  . Cervical myofascial pain syndrome  . Migraine without aura  . Perennial allergic rhinitis with seasonal variation  . Arthropathy of right sacroiliac joint  . Neck pain, chronic  . Chronic right SI joint pain  . Chronic fatigue syndrome with fibromyalgia  . Arthralgia of multiple joints  . Chronic pain syndrome  . Benign essential hypertension  . Cervical paraspinal muscle spasm  . Reactive airway disease without asthma  . Primary osteoarthritis involving multiple joints  . Chest pain, non-cardiac     Allergies: Lisinopril, Amlodipine , and Tizanidine    Medications:   Outpatient Medications Prior to Visit  Medication Sig Dispense Refill  . acetaminophen  (TYLENOL ) 500 mg tablet Take 1,000 mg by mouth every 6 (six) hours.    . cyclobenzaprine  (FLEXERIL ) 10 mg tablet Take 1 tablet (10 mg total) by mouth 3 (three) times a day as needed for muscle spasms. 90 tablet 1  . flash glucose sensor kit Inject 1 sensor to the skin every 14 days for continuous glucose monitoring. (Patient not taking: Reported on 12/15/2023) 2 kit 11  . hydroCHLOROthiazide (HYDRODIURIL) 25 mg tablet Take 1 tablet (25 mg total) by mouth daily. 90 tablet 0  . losartan (COZAAR) 100 mg tablet Take 1 tablet (100 mg total) by mouth daily. 90 tablet 3  . methyl salicylate/menthol (MUSCLE RUB TOP) Apply topically as needed.    SABRA MISCELLANEOUS MEDICATION/PRODUCT Take 6 mg by mouth daily. Medication/Product Name: Naltrexone 6.0 mg daily 90 capsule 1  . pantoprazole (PROTONIX) 40 mg EC tablet Take 1 tablet (40 mg total) by mouth daily.  90 tablet 3  . potassium chloride  (KLOR-CON ) 20 mEq ER tablet Take 20 mEq by mouth.    . pseudoephedrine-guaiFENesin (Mucinex D) 60-600 mg per 12 hr tablet Take 1 tablet by mouth every 12 (twelve) hours. 20 tablet 1  . UNABLE TO FIND Tylenol  cream QID PRN    . UNABLE TO FIND Tiger bomb PRN    .  amitriptyline  (ELAVIL ) 100 mg tablet Take 1 tablet (100 mg total) by mouth nightly. 90 tablet 1  . topiramate (TOPAMAX) 50 mg tablet Take 25mg  (0.5 tablet daily) for one week. Then, increase to 50mg  (tablet) daily. 30 tablet 3   No facility-administered medications prior to visit.     OBJECTIVE  Physical Exam Vitals:   01/30/24 1046  BP: 123/79  BP Location: Left arm  Patient Position: Sitting  Pulse: 98  Temp: 98.1 F (36.7 C)  TempSrc: Skin  Weight: 81.9 kg (180 lb 9.6 oz)   Gen: NAD, patient is uncomfortable and goes from sitting to standing to sitting multiple times during the appointment.  Using cane today HEENT: Eyes - conjunctiva clear,  Cardio: Normal rate,  Resp: No increased WOB Abdomen: Nondistended  Extremities: No edema.  No clubbing or cyanosis. Skin: Warm and dry. No concerning rashes or lesions. Neurologic: Answers questions appropriately. No focal deficits.  4/5 strength on hip flexion and knee flexion and extension Psych: Good eye-contact. Pleasant, cooperative. Appropriate affect.   ASSESSMENT & PLAN  Melissa Clay was seen today for follow-up.  Diagnoses and all orders for this visit:  Intractable migraine without aura and without status migrainosus -     Hemoglobin A1C With Estimated Average Glucose; Future -     Comprehensive Metabolic Panel; Future -     Ferritin; Future -     Vitamin B12 -     Vitamin D, 25-Hydroxy  Fibromyalgia -     Ambulatory referral to Physical Therapy; Future  Screening for lipid disorders -     Lipid Panel; Future  Other orders -     topiramate (TOPAMAX) 100 mg tablet; Take 1 tablet (100 mg total) by mouth 2 (two) times a day.    No problem-specific Assessment & Plan notes found for this encounter.    Discussion/MDM:  Assessment & Plan Migraines: - Reports experiencing at least 2 migraines per month and daily tension headaches. - No significant improvement in migraine frequency despite being on Topamax 50 mg -  Recommended physical therapy for migraines and massage therapy for muscle relaxation. - Increase Topamax dosage to 100 mg - Referral to headache clinic for further evaluation and management.  Fibromyalgia: - Reports worsening pain associated with fibromyalgia, sometimes requiring the use of a wheelchair or walker. - Currently on multiple medications to aid with pain  - endorsing muscle weakness and does have weakness on exam but unknown if this is due to pain - Continue follow-up with pain clinic, next appointment scheduled for 04/2024.  Disability: - Has been out of work since 2022  - states she does not need additional paperwork at this time - Refer for functional capacity evaluation to aid in disability claim. - Advised to have lawyer provide another copy of necessary forms for functional assessment, to be completed prior to meeting with lawyer.    Scheduled Future Appointments       Provider Department Dept Phone Center   02/17/2024 8:15 AM [facility]   - [place] [facility] DORIAN FONDER [phone] [facility]   03/04/2024 10:15  AM [facility]  - Family Medicine PPI [phone] [facility]   04/11/2024 3:30 PM Doreatha [facility] - [facility] [place] [phone] [facility] [address] [facility]        Asberry Devere Free, MD

## 2024-03-27 NOTE — Telephone Encounter (Signed)
 Please call patient to inform that Dr. Sherre has transitioned from the clinic, and since I have not personally evaluated the patient, I am unable to complete her forms at this time. Dr. Sherre had mentioned having a functional capacity evaluation by physical therapy, please inquire if patient is amenable to doing this. Thank you.

## 2024-03-28 ENCOUNTER — Emergency Department (HOSPITAL_BASED_OUTPATIENT_CLINIC_OR_DEPARTMENT_OTHER)
Admission: EM | Admit: 2024-03-28 | Discharge: 2024-03-28 | Disposition: A | Discharge status: Home / Self Care | Attending: Emergency Medicine | Admitting: Emergency Medicine

## 2024-03-28 ENCOUNTER — Other Ambulatory Visit: Payer: Self-pay

## 2024-03-28 ENCOUNTER — Encounter (HOSPITAL_BASED_OUTPATIENT_CLINIC_OR_DEPARTMENT_OTHER): Payer: Self-pay | Admitting: Emergency Medicine

## 2024-03-28 ENCOUNTER — Emergency Department (HOSPITAL_BASED_OUTPATIENT_CLINIC_OR_DEPARTMENT_OTHER)

## 2024-03-28 DIAGNOSIS — R55 Syncope and collapse: Secondary | ICD-10-CM | POA: Diagnosis present

## 2024-03-28 DIAGNOSIS — D72829 Elevated white blood cell count, unspecified: Secondary | ICD-10-CM | POA: Insufficient documentation

## 2024-03-28 DIAGNOSIS — E860 Dehydration: Secondary | ICD-10-CM | POA: Diagnosis not present

## 2024-03-28 DIAGNOSIS — Z79899 Other long term (current) drug therapy: Secondary | ICD-10-CM | POA: Insufficient documentation

## 2024-03-28 DIAGNOSIS — I10 Essential (primary) hypertension: Secondary | ICD-10-CM | POA: Diagnosis not present

## 2024-03-28 DIAGNOSIS — E876 Hypokalemia: Secondary | ICD-10-CM | POA: Diagnosis not present

## 2024-03-28 LAB — COMPREHENSIVE METABOLIC PANEL WITH GFR
ALT: 26 U/L (ref 0–44)
AST: 27 U/L (ref 15–41)
Albumin: 4.9 g/dL (ref 3.5–5.0)
Alkaline Phosphatase: 58 U/L (ref 38–126)
Anion gap: 15 (ref 5–15)
BUN: 18 mg/dL (ref 6–20)
CO2: 27 mmol/L (ref 22–32)
Calcium: 10.2 mg/dL (ref 8.9–10.3)
Chloride: 100 mmol/L (ref 98–111)
Creatinine, Ser: 1.06 mg/dL — ABNORMAL HIGH (ref 0.44–1.00)
GFR, Estimated: >60 mL/min (ref >60)
Glucose, Bld: 106 mg/dL — ABNORMAL HIGH (ref 70–99)
Potassium: 2.5 mmol/L — CL (ref 3.5–5.1)
Sodium: 141 mmol/L (ref 135–145)
Total Bilirubin: 0.3 mg/dL (ref 0.0–1.2)
Total Protein: 7.9 g/dL (ref 6.5–8.1)

## 2024-03-28 LAB — CBC
HCT: 39.4 % (ref 36.0–46.0)
Hemoglobin: 12.6 g/dL (ref 12.0–15.0)
MCH: 25.5 pg — ABNORMAL LOW (ref 26.0–34.0)
MCHC: 32 g/dL (ref 30.0–36.0)
MCV: 79.6 fL — ABNORMAL LOW (ref 80.0–100.0)
Platelets: 313 K/uL (ref 150–400)
RBC: 4.95 MIL/uL (ref 3.87–5.11)
RDW: 14.6 % (ref 11.5–15.5)
WBC: 12.2 K/uL — ABNORMAL HIGH (ref 4.0–10.5)
nRBC: 0 % (ref 0.0–0.2)

## 2024-03-28 LAB — RESP PANEL BY RT-PCR (RSV, FLU A&B, COVID)  RVPGX2
Influenza A by PCR: NEGATIVE
Influenza B by PCR: NEGATIVE
Resp Syncytial Virus by PCR: NEGATIVE
SARS Coronavirus 2 by RT PCR: NEGATIVE

## 2024-03-28 LAB — MAGNESIUM: Magnesium: 2.1 mg/dL (ref 1.7–2.4)

## 2024-03-28 LAB — D-DIMER, QUANTITATIVE: D-Dimer, Quant: 0.42 ug{FEU}/mL (ref 0.00–0.50)

## 2024-03-28 LAB — TROPONIN T, HIGH SENSITIVITY: Troponin T High Sensitivity: <15 ng/L (ref <19)

## 2024-03-28 LAB — CBG MONITORING, ED: Glucose-Capillary: 83 mg/dL (ref 70–99)

## 2024-03-28 MED ORDER — POTASSIUM CHLORIDE 20 MEQ PO PACK
40.0000 meq | PACK | Freq: Once | ORAL | Status: AC
  Administered 2024-03-28: 40 meq via ORAL
  Filled 2024-03-28: qty 2

## 2024-03-28 MED ORDER — LOPERAMIDE HCL 2 MG PO CAPS: 2.0000 mg | ORAL_CAPSULE | ORAL | 0 refills | Status: AC | PRN

## 2024-03-28 MED ORDER — POTASSIUM CHLORIDE ER 10 MEQ PO TBCR: 20.0000 meq | EXTENDED_RELEASE_TABLET | Freq: Every day | ORAL | 0 refills | Status: AC

## 2024-03-28 MED ORDER — POTASSIUM CHLORIDE CRYS ER 20 MEQ PO TBCR
40.0000 meq | EXTENDED_RELEASE_TABLET | Freq: Once | ORAL | Status: AC
  Administered 2024-03-28: 40 meq via ORAL
  Filled 2024-03-28: qty 2

## 2024-03-28 MED ORDER — LACTATED RINGERS IV BOLUS
1000.0000 mL | Freq: Once | INTRAVENOUS | Status: AC
  Administered 2024-03-28: 1000 mL via INTRAVENOUS

## 2024-03-28 NOTE — ED Triage Notes (Signed)
 Pt POV in wheelchair- reports unwitnessed syncopal episode today appx 1200.  C/o pain on R side, generalized weakness.  Also reports diarrhea, for a while now; intermittent chills.

## 2024-03-28 NOTE — ED Notes (Signed)
 Patient able to ambulate approx 20 feet independently with cane. Patient then began reporting dizziness. Patient assisted back to room with wheelchair. Provider made aware.

## 2024-03-28 NOTE — ED Provider Notes (Signed)
 [place] [facility] Provider Note   CSN: [id number] Arrival date & time: 03/28/24  1433     Patient presents with: Loss of Consciousness   Melissa Clay is a 48 y.o. female with history of fibromyalgia, hypertension, presents with concern for a syncopal episode that occurred around noon today.  States she stood up and went to walk into another room when she started to feel very faint and dizzy.  Denies any chest pain or shortness of breath before or after the fall.  She then remembers waking up on the floor and was feeling generally weak.  She does report some pain on the right side of her head.  This fall was not witnessed by anyone else.  She reports she has also been having some nonbloody diarrhea for the past 1 to 2 weeks.  Denies any recent antibiotic use or any outside the country travel.  She denies any fever or chills.    Loss of Consciousness      Prior to Admission medications   Medication Sig Start Date End Date Taking? Authorizing Provider  loperamide  (IMODIUM ) 2 MG capsule Take 1 capsule (2 mg total) by mouth as needed. Take 2 tablets by mouth once, then take one tablet by mouth after each loose stool. 03/28/24  Yes Veta Palma, PA-C  potassium chloride  (KLOR-CON ) 10 MEQ tablet Take 2 tablets (20 mEq total) by mouth daily for 7 days. 03/28/24 04/04/24 Yes Veta Palma, PA-C  amitriptyline  (ELAVIL ) 25 MG tablet Take 4 tablets (100 mg total) by mouth daily for 30 days. 04/20/21     amitriptyline  (ELAVIL ) 25 MG tablet Take 4 tablets (100 mg total) by mouth daily for 30 days. 05/18/21     amLODipine  (NORVASC ) 5 MG tablet Take 1 tablet (5 mg total) by mouth daily. 04/20/21 05/20/21  Venter, Margaux, PA-C  amoxicillin -clavulanate (AUGMENTIN ) 875-125 MG tablet Take 1 tablet by mouth 2 times daily for 7 days. 06/21/21     butalbital-acetaminophen -caffeine (FIORICET WITH CODEINE) 50-325-40-30 MG capsule Take by mouth.    [provider]   celecoxib  (CELEBREX ) 200 MG capsule Take 1 capsule (200 mg total) by mouth 2 (two) times daily. 09/15/19   Harris, Abigail, PA-C  chlorthalidone (HYGROTON) 25 MG tablet TAKE ONE-HALF TABLET BY MOUTH EVERY DAY 06/13/19   [provider]  cyclobenzaprine  (FLEXERIL ) 10 MG tablet Take 0.5-1 tablets (5-10 mg total) by mouth 2 (two) times daily as needed for muscle spasms. 11/30/21   Harris, Abigail, PA-C  hydrALAZINE (APRESOLINE) 25 MG tablet Take 25 mg by mouth. 01/28/16   [provider]  meloxicam  (MOBIC ) 7.5 MG tablet Take 1 tablet (7.5 mg total) by mouth daily. 09/18/22   Schutt, Marsa HERO, PA-C  methylPREDNISolone  (MEDROL  DOSEPAK) 4 MG TBPK tablet Use as directed 09/15/19   Harris, Abigail, PA-C  nortriptyline  (PAMELOR ) 25 MG capsule Take 1 capsule (25 mg total) by mouth at bedtime. 04/18/16   Hudnall, Ludie SAUNDERS, MD  omeprazole (PRILOSEC) 20 MG capsule Take 20 mg by mouth. 01/14/16 01/13/17  [provider]  propranolol (INDERAL) 40 MG tablet Take 40 mg by mouth. 01/28/16   [provider]    Allergies: Lisinopril and Amlodipine     Review of Systems  Cardiovascular:  Positive for syncope.    Updated Vital Signs BP (S) 117/88 (BP Location: Left Arm)   Pulse 95   Temp 98.1 F (36.7 C)   Resp 14   Ht 5' 2 (1.575 m)   Wt  77.1 kg   LMP 04/05/2016   SpO2 98%   BMI 31.09 kg/m   Physical Exam Vitals and nursing note reviewed.  Constitutional:      General: She is not in acute distress.    Appearance: She is well-developed.  HENT:     Head: Normocephalic and atraumatic.  Eyes:     Extraocular Movements: Extraocular movements intact.     Conjunctiva/sclera: Conjunctivae normal.     Pupils: Pupils are equal, round, and reactive to light.  Cardiovascular:     Rate and Rhythm: Normal rate and regular rhythm.     Heart sounds: No murmur heard. Pulmonary:     Effort: Pulmonary effort is normal. No respiratory distress.     Breath sounds: Normal breath sounds.   Abdominal:     Palpations: Abdomen is soft.     Tenderness: There is no abdominal tenderness.  Musculoskeletal:        General: No swelling.     Cervical back: Neck supple.     Comments: General No obvious deformity. No erythema, edema, contusions, open wounds   Palpation Non-tender to palpation of the clavicles,humerus, radius and ulna, carpal bones, 1st-5th metacarpals and phalanges bilaterally Non tender over the femur, patella, tibia or fibula bilaterally  Tender over the cervical spine.  Nontender of the thoracic or lumbar spinous processes.  Does have some generalized tenderness to the musculature of the back secondary to her fibromyalgia  No tenderness of the pelvis diffusely  ROM Full ROM of shoulders bilaterally Full elbow, wrist, knee flexion and extension bilaterally Intact plantarflexion and dorsiflexion, hip flexion bilaterally  Sensation: Sensation intact throughout the bilateral upper and lower extremity  Strength: 5/5 strength with resisted elbow and wrist flexion and extension bilaterally 5/5 strength with resisted knee flexion and extension and ankle plantarflexion and dorsiflexion bilaterally    Skin:    General: Skin is warm and dry.     Capillary Refill: Capillary refill takes less than 2 seconds.  Neurological:     General: No focal deficit present.     Mental Status: She is alert.     Comments: Mental status: Alert and oriented to self, place, and month  Speech: Answers questions appropriately  Cranial Nerves: III, IV, VI: EOM intact, Pupils equal round and reactive, no gaze preference or deviation, no nystagmus. V: Normal sensation to V1, V2, and V3 segments bilaterally VII: smiles, puffs cheeks, raises eyebrows, and closes eyes without asymmetry.  VIII: normal hearing to speech IX, X: normal palatal elevation, no uvular deviation XI: 5/5 head turn and 5/5 shoulder shrug bilaterally XII: midline tongue protrusion  Motor: 5/5 strength with  resisted elbow flexion and extension, wrist flexion and extension.  4/5 with ankle plantarflexion and dorsiflexion bilaterally   Sensory: Intact sensation in upper and lower extremity bilaterally    Psychiatric:        Mood and Affect: Mood normal.     (all labs ordered are listed, but only abnormal results are displayed) Labs Reviewed  COMPREHENSIVE METABOLIC PANEL WITH GFR - Abnormal; Notable for the following components:      Result Value   Potassium 2.5 (*)    Glucose, Bld 106 (*)    Creatinine, Ser 1.06 (*)    All other components within normal limits  CBC - Abnormal; Notable for the following components:   WBC 12.2 (*)    MCV 79.6 (*)    MCH 25.5 (*)    All other components within normal limits  RESP  PANEL BY RT-PCR (RSV, FLU A&B, COVID)  [id number]  MAGNESIUM  D-DIMER, QUANTITATIVE  URINALYSIS, ROUTINE W REFLEX MICROSCOPIC  CBG MONITORING, ED  TROPONIN T, HIGH SENSITIVITY    EKG: EKG Interpretation Date/Time:  Thursday March 28 2024 14:47:49 EDT Ventricular Rate:  104 PR Interval:  163 QRS Duration:  105 QT Interval:  344 QTC Calculation: 453 R Axis:   97  Text Interpretation: Sinus tachycardia Borderline right axis deviation Borderline repolarization abnormality Confirmed by Darra Chew [phone]) on 03/28/2024 2:49:08 PM  Radiology: CT Head Wo Contrast Result Date: 03/28/2024 CLINICAL DATA:  Syncopal episode, head and neck trauma EXAM: CT HEAD WITHOUT CONTRAST CT CERVICAL SPINE WITHOUT CONTRAST TECHNIQUE: Multidetector CT imaging of the head and cervical spine was performed following the standard protocol without intravenous contrast. Multiplanar CT image reconstructions of the cervical spine were also generated. RADIATION DOSE REDUCTION: This exam was performed according to the departmental dose-optimization program which includes automated exposure control, adjustment of the mA and/or kV according to patient size and/or use of iterative reconstruction technique.  COMPARISON:  None Available. FINDINGS: CT HEAD FINDINGS Brain: No evidence of acute infarction, hemorrhage, hydrocephalus, extra-axial collection or mass lesion/mass effect. Vascular: No hyperdense vessel or unexpected calcification. Skull: Normal. Negative for fracture or focal lesion. Sinuses/Orbits: No acute finding. Other: None. CT CERVICAL SPINE FINDINGS Alignment: Normal. Skull base and vertebrae: No acute fracture. No primary bone lesion or focal pathologic process. Soft tissues and spinal canal: No prevertebral fluid or swelling. No visible canal hematoma. Disc levels:  Mild multilevel cervical disc degenerative disease. Upper chest: Negative. Other: None. IMPRESSION: 1. No acute intracranial pathology. 2. No fracture or static subluxation of the cervical spine. 3. Mild multilevel cervical disc degenerative disease. Electronically Signed   By: Marolyn JONETTA Jaksch M.D.   On: 03/28/2024 17:14   CT Cervical Spine Wo Contrast Result Date: 03/28/2024 CLINICAL DATA:  Syncopal episode, head and neck trauma EXAM: CT HEAD WITHOUT CONTRAST CT CERVICAL SPINE WITHOUT CONTRAST TECHNIQUE: Multidetector CT imaging of the head and cervical spine was performed following the standard protocol without intravenous contrast. Multiplanar CT image reconstructions of the cervical spine were also generated. RADIATION DOSE REDUCTION: This exam was performed according to the departmental dose-optimization program which includes automated exposure control, adjustment of the mA and/or kV according to patient size and/or use of iterative reconstruction technique. COMPARISON:  None Available. FINDINGS: CT HEAD FINDINGS Brain: No evidence of acute infarction, hemorrhage, hydrocephalus, extra-axial collection or mass lesion/mass effect. Vascular: No hyperdense vessel or unexpected calcification. Skull: Normal. Negative for fracture or focal lesion. Sinuses/Orbits: No acute finding. Other: None. CT CERVICAL SPINE FINDINGS Alignment: Normal.  Skull base and vertebrae: No acute fracture. No primary bone lesion or focal pathologic process. Soft tissues and spinal canal: No prevertebral fluid or swelling. No visible canal hematoma. Disc levels:  Mild multilevel cervical disc degenerative disease. Upper chest: Negative. Other: None. IMPRESSION: 1. No acute intracranial pathology. 2. No fracture or static subluxation of the cervical spine. 3. Mild multilevel cervical disc degenerative disease. Electronically Signed   By: Marolyn JONETTA Jaksch M.D.   On: 03/28/2024 17:14     Procedures   Medications Ordered in the ED  lactated ringers  bolus 1,000 mL (0 mLs Intravenous Stopped 03/28/24 1746)  potassium chloride  SA (KLOR-CON  M) CR tablet 40 mEq (40 mEq Oral Given 03/28/24 1732)  potassium chloride  (KLOR-CON ) packet 40 mEq (40 mEq Oral Given 03/28/24 1840)  Medical Decision Making Amount and/or Complexity of Data Reviewed Labs: ordered. Radiology: ordered.  Risk Prescription drug management.     Differential diagnosis includes but is not limited to orthostatic hypotension, dehydration, electrolyte abnormality, arrhythmia, pulmonary embolism, CVA, seizure  ED Course:  Upon initial evaluation, patient is well-appearing, no acute distress.  Stable vitals aside from a mild tachycardia to about 104.  No neurologic deficits.  Low concern for CVA at this time. No obvious head trauma.  She does have some mild cervical spine tenderness to palpation, but no tenderness elsewhere of the upper or lower extremities bilaterally. Will obtain CT head and cervical spine for further evaluation as well as labs.  Labs Ordered: I Ordered, and personally interpreted labs.  The pertinent results include:   CBC with leukocytosis of 12.2 CMP with hypokalemia at 2.5.  Mildly elevated creatinine at 1.06.  No elevation in LFTs.  Otherwise normal electrolytes Magnesium within normal limits D-dimer within normal limits Troponin under  15 COVID, flu, RSV negative  Imaging Studies ordered: I ordered imaging studies including CT head, CT cervical spine I independently visualized the imaging with scope of interpretation limited to determining acute life threatening conditions related to emergency care. Imaging showed  No acute abnormalities I agree with the radiologist interpretation   Cardiac Monitoring: / EKG: The patient was maintained on a cardiac monitor.  I personally viewed and interpreted the cardiac monitored which showed an underlying rhythm of: Normal sinus rhythm, no ST changes   Medications Given: 40 mEq Kcl. (Patient not able to tolerate the packet form so was given a tablet form) LR bolus  Upon re-evaluation, patient reports she is feeling at baseline.  Orthostatic vitals were obtained which were unremarkable.  No significant drop in blood pressure or elevation in heart rate with changing position.  However, she reports her dizziness is when changing positions, so suspect she could be having some drop in blood pressure causing her syncopal episode today. She has been reporting about 2-3 episodes of nonbloody diarrhea daily, I suspect this is the cause of her dehydration and hypokalemia here today.  No concern for infectious etiology as she is not having any fever, abdominal pain, no recent travel.  Has not been on any antibiotics, no recent hospitalizations, no concern for C. difficile.  Will try course of loperamide  to help with her symptoms.  Will also prescribe potassium supplements at home for her hypokalemia. She not have any neurodeficits on exam, CT head without any abnormalities, have low concern for CVA as the cause of her syncopal episode earlier today.  There is no evidence of intracranial hemorrhage secondary to her fall. She did not have any chest pain or shortness of breath before or after the fall, initial troponin under 15, EKG without any ST elevations, low concern for ACS.  No signs of arrhythmia  on her EKG.  Her EKG was initially showing sinus tachycardia upon arrival, though improved to normal sinus rhythm with fluids. D-dimer within normal limits, and given improvement in tachycardia with fluids, no chest pain or shortness of breath, have low concern for pulmonary emphysema at this time.   Patient was ambulated by nursing without difficulty.  Has been able to tolerate p.o. intake here.  Low concern for emergent etiology at this time, do not feel she needs any further workup or observation at this time.  Stable and appropriate for discharge home  Impression: Syncopal episode, likely from orthostatic hypotension Hypokalemia Diarrhea  Disposition:  The patient was discharged  home with instructions to keep well-hydrated with water at home.  Take the prescribed potassium supplements at home for the next week.  May take loperamide  as prescribed for diarrhea.  Follow-up with her PCP within the next week for recheck of her potassium and symptoms. Return precautions given.   This chart was dictated using voice recognition software, Dragon. Despite the best efforts of this provider to proofread and correct errors, errors may still occur which can change documentation meaning.       Final diagnoses:  Syncope, unspecified syncope type  Hypokalemia  Dehydration    ED Discharge Orders          Ordered    potassium chloride  (KLOR-CON ) 10 MEQ tablet  Daily        03/28/24 1900    loperamide  (IMODIUM ) 2 MG capsule  As needed        03/28/24 1900               Veta Palma, PA-C 03/28/24 1910    Doretha Folks, MD 03/31/24 2220

## 2024-03-28 NOTE — Discharge Instructions (Addendum)
 Your workup showed that you are slightly dehydrated here today as your creatinine (kidney function) was slightly abnormal here today.  Please increase your intake of water at home.  Your potassium was low here today (2.5).  You were given a potassium supplement here.  You have also been prescribed a potassium supplement to take daily at home for the next 7 days.    You have been prescribed a medication for your diarrhea called loperamide . Take 4mg  initally, then 2mg  after each loose stool. Do not take more than 16mg  per day.  Please follow-up with your PCP within the next week for recheck of your symptoms and recheck of your potassium.  Your blood counts are normal today aside from a mildly elevated white blood cell count which can be secondary to the diarrhea you are having.  Your liver tests were normal today.  Your heart test (troponin) was normal today.  Your EKG which was your heart rhythm was normal today.  Your lab test do not show any signs of a blood clot in your lungs.  Your COVID, flu, RSV testing was negative today  The CT of your head and neck did not show any acute abnormality such as a brain bleed or skull fracture.  No signs of a stroke.   Please return immediately to the emergency room for further episodes of passing out, chest pain, shortness of breath, any other new or concerning symptoms

## 2024-05-04 IMAGING — US US EXTREM  UP VENOUS*R*
1 series · 13 of 24 positions shown · non-contrast
Comparison: None Available.

CLINICAL DATA: Right arm swelling and pain



[Series 2: us extrem up venous*right* · 35 acquisitions, 13 frames shown]
[im 1/35]
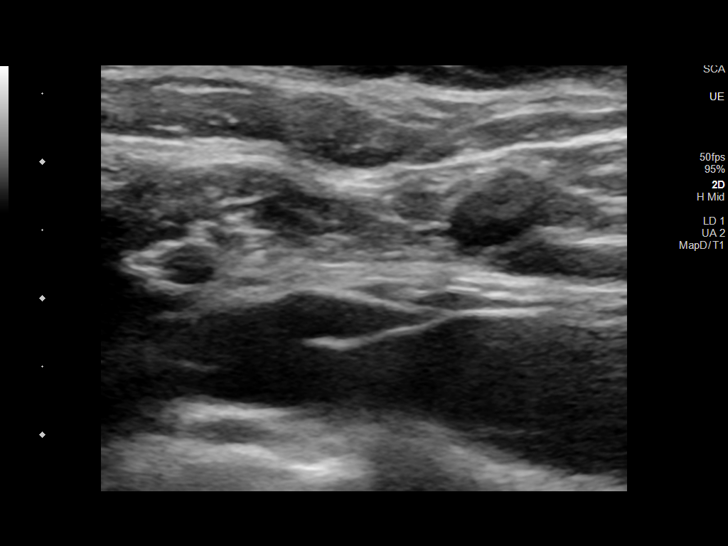
[im 3/35]
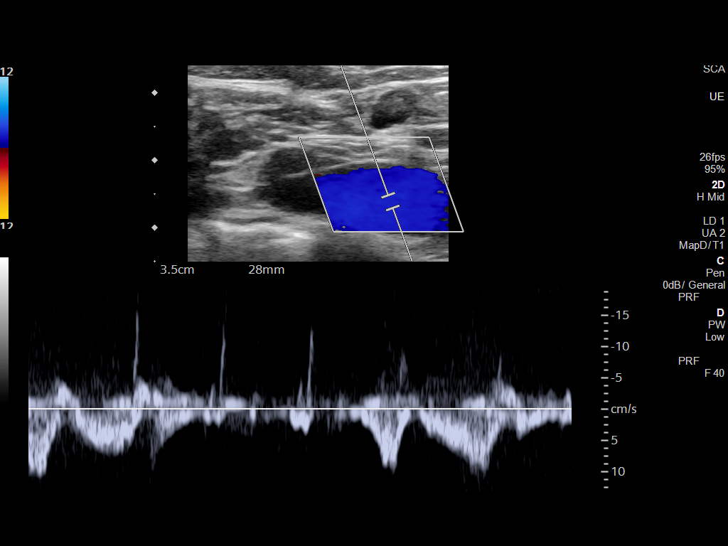
[im 6/35]
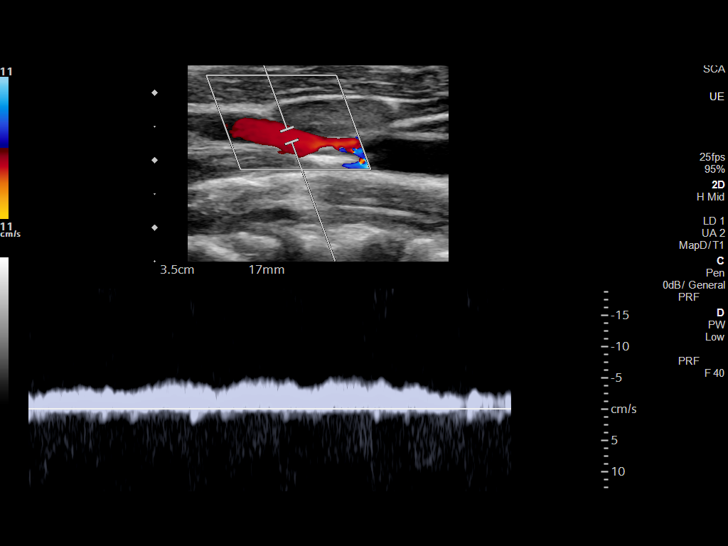
[im 9/35]
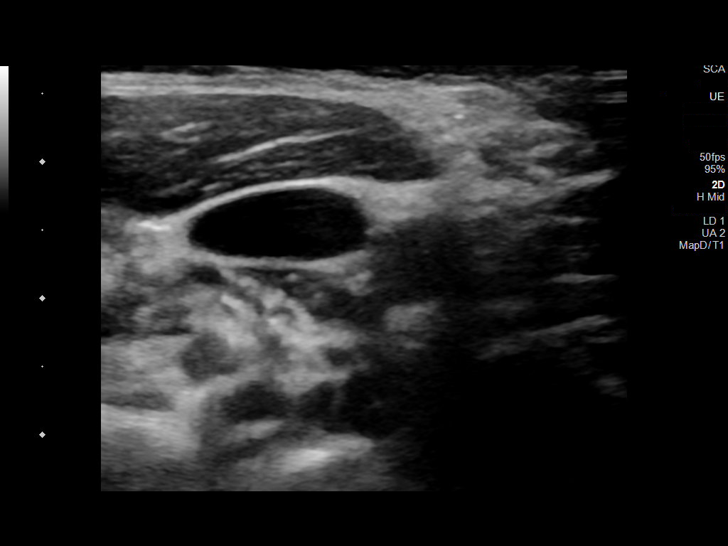
[im 11/35]
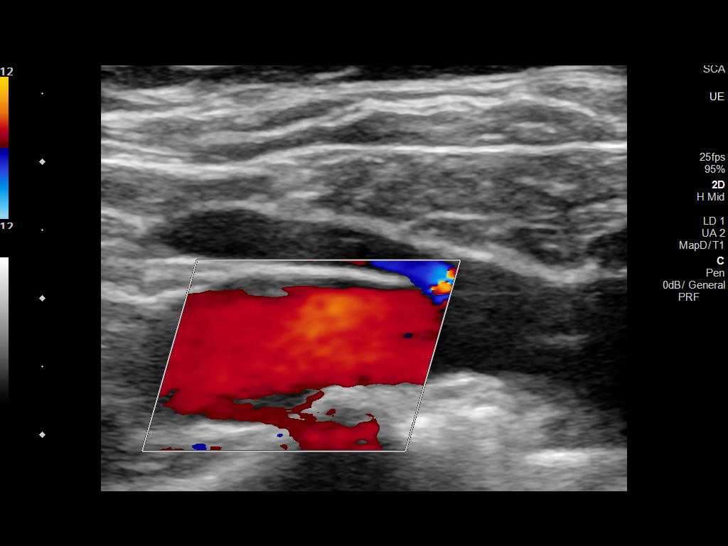
[im 14/35]
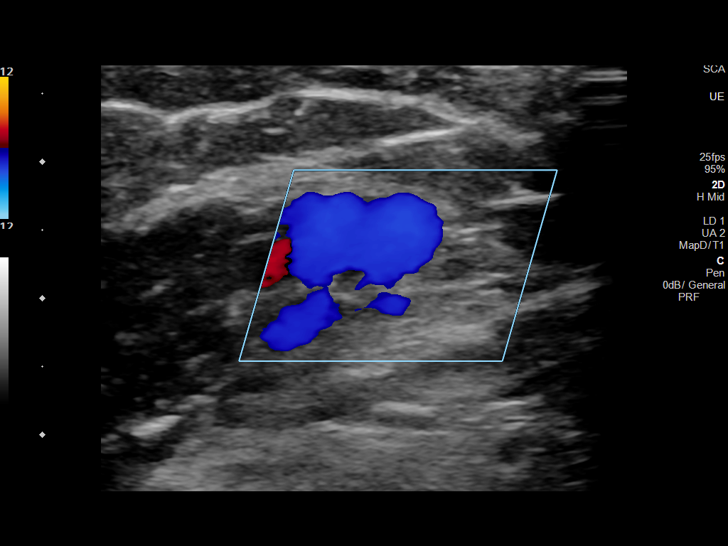
[im 18/35]
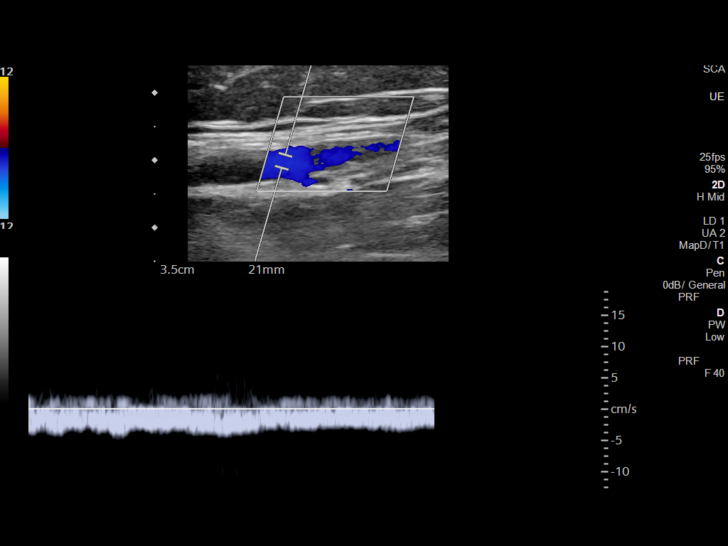
[im 20/35]
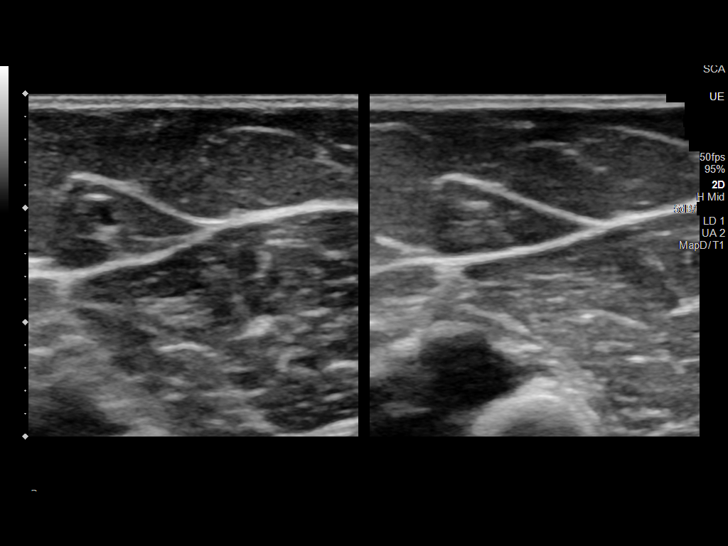
[im 23/35]
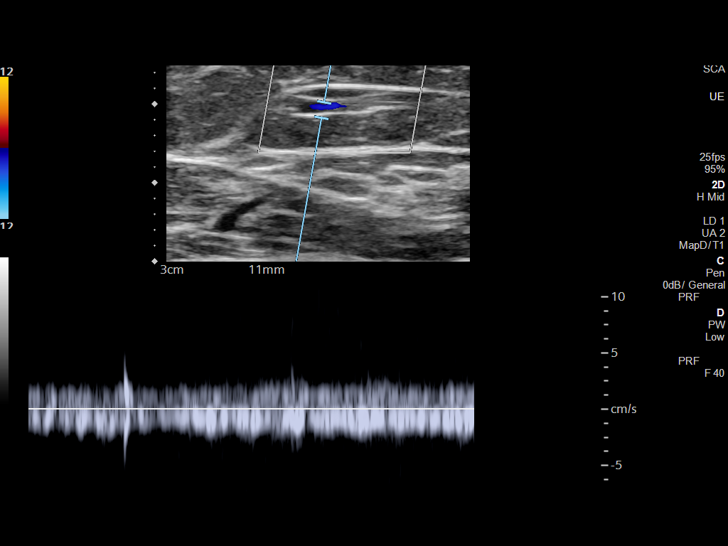
[im 26/35]
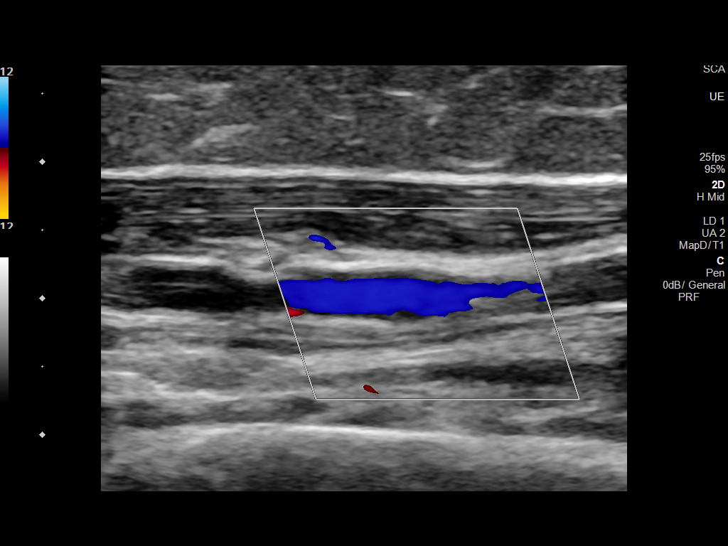
[im 29/35]
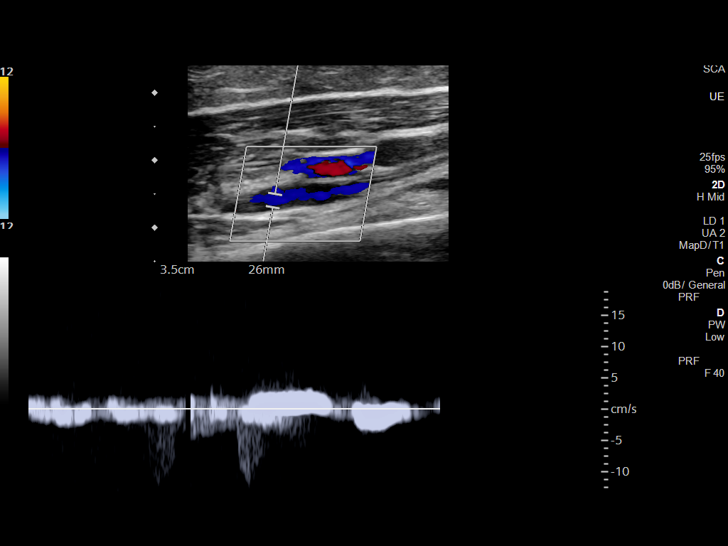
[im 32/35]
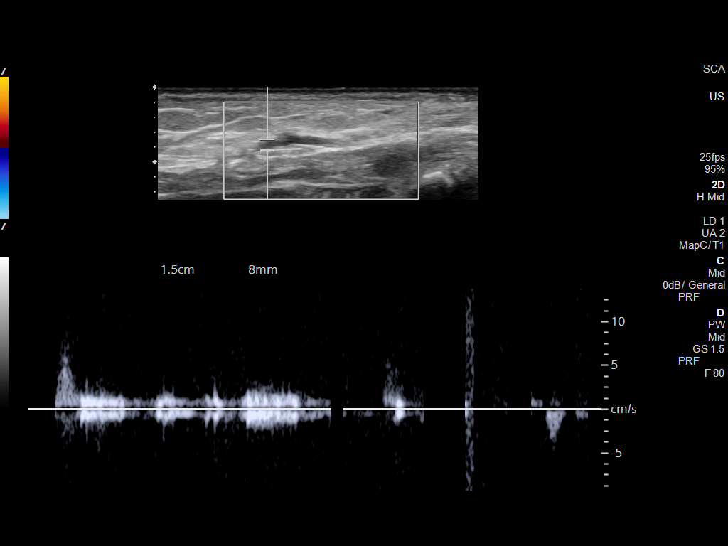
[im 35/35]
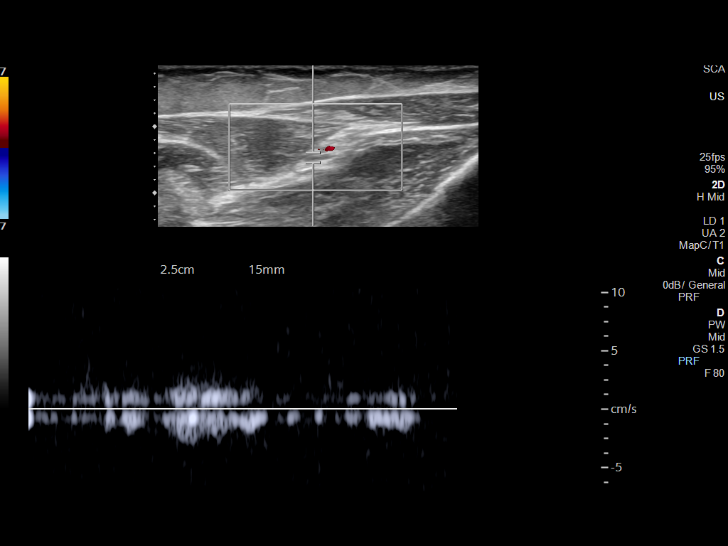

[13 of 24 positions shown; findings below may reference images not displayed]

FINDINGS: Contralateral Subclavian Vein: Respiratory phasicity is normal and
symmetric with the symptomatic side. No evidence of thrombus. Normal
compressibility.

Internal Jugular Vein: No evidence of thrombus. Normal
compressibility, respiratory phasicity and response to augmentation.

Subclavian Vein: No evidence of thrombus. Normal compressibility,
respiratory phasicity and response to augmentation.

Axillary Vein: No evidence of thrombus. Normal compressibility,
respiratory phasicity and response to augmentation.

Cephalic Vein: No evidence of thrombus. Normal compressibility,
respiratory phasicity and response to augmentation.

Basilic Vein: No evidence of thrombus. Normal compressibility,
respiratory phasicity and response to augmentation.

Brachial Veins: No evidence of thrombus. Normal compressibility,
respiratory phasicity and response to augmentation.

Radial Veins: No evidence of thrombus. Normal compressibility,
respiratory phasicity and response to augmentation.

Ulnar Veins: No evidence of thrombus. Normal compressibility,
respiratory phasicity and response to augmentation.

Venous Reflux:  None visualized.

Other Findings: Enlarged lymph node overlying the right internal
jugular vein measuring 2.0 x 0.6 cm.
IMPRESSION: 1. Negative examination for deep venous thrombosis in the right
upper extremity.

2. Enlarged cervical lymph node overlying the right internal jugular
vein, nonspecific.
